# Patient Record
Sex: Female | Born: 1985 | Race: Black or African American | Hispanic: No | Marital: Married | Smoking: Former smoker
Health system: Southern US, Community
[De-identification: ages and names within clinical notes are randomized; demographics above are authoritative.]

## PROBLEM LIST (undated history)

## (undated) DIAGNOSIS — F419 Anxiety disorder, unspecified: Secondary | ICD-10-CM

## (undated) DIAGNOSIS — R0600 Dyspnea, unspecified: Secondary | ICD-10-CM

## (undated) DIAGNOSIS — M359 Systemic involvement of connective tissue, unspecified: Secondary | ICD-10-CM

## (undated) DIAGNOSIS — R002 Palpitations: Secondary | ICD-10-CM

## (undated) DIAGNOSIS — IMO0002 Reserved for concepts with insufficient information to code with codable children: Secondary | ICD-10-CM

## (undated) DIAGNOSIS — K219 Gastro-esophageal reflux disease without esophagitis: Secondary | ICD-10-CM

## (undated) DIAGNOSIS — I499 Cardiac arrhythmia, unspecified: Secondary | ICD-10-CM

## (undated) DIAGNOSIS — I10 Essential (primary) hypertension: Secondary | ICD-10-CM

## (undated) DIAGNOSIS — G4730 Sleep apnea, unspecified: Secondary | ICD-10-CM

## (undated) DIAGNOSIS — K589 Irritable bowel syndrome without diarrhea: Secondary | ICD-10-CM

## (undated) DIAGNOSIS — M329 Systemic lupus erythematosus, unspecified: Secondary | ICD-10-CM

## (undated) DIAGNOSIS — T4145XA Adverse effect of unspecified anesthetic, initial encounter: Secondary | ICD-10-CM

## (undated) DIAGNOSIS — D693 Immune thrombocytopenic purpura: Secondary | ICD-10-CM

## (undated) DIAGNOSIS — Q632 Ectopic kidney: Secondary | ICD-10-CM

## (undated) DIAGNOSIS — T8859XA Other complications of anesthesia, initial encounter: Secondary | ICD-10-CM

## (undated) DIAGNOSIS — D649 Anemia, unspecified: Secondary | ICD-10-CM

## (undated) DIAGNOSIS — J45909 Unspecified asthma, uncomplicated: Secondary | ICD-10-CM

## (undated) DIAGNOSIS — G43909 Migraine, unspecified, not intractable, without status migrainosus: Secondary | ICD-10-CM

## (undated) HISTORY — DX: Sleep apnea, unspecified: G47.30

## (undated) HISTORY — PX: WRIST SURGERY: SHX841

## (undated) HISTORY — PX: HERNIA REPAIR: SHX51

## (undated) HISTORY — DX: Essential (primary) hypertension: I10

---
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
## 2007-09-25 ENCOUNTER — Emergency Department: Payer: Self-pay | Admitting: Emergency Medicine

## 2008-11-16 ENCOUNTER — Ambulatory Visit (HOSPITAL_COMMUNITY): Admission: RE | Admit: 2008-11-16 | Discharge: 2008-11-16 | Payer: Self-pay | Admitting: Internal Medicine

## 2008-11-19 ENCOUNTER — Emergency Department (HOSPITAL_COMMUNITY): Admission: EM | Admit: 2008-11-19 | Discharge: 2008-11-19 | Payer: Self-pay | Admitting: Emergency Medicine

## 2010-04-29 ENCOUNTER — Encounter: Payer: Self-pay | Admitting: Internal Medicine

## 2010-08-23 NOTE — H&P (Signed)
   NAMEMonee, Jones NO.:  [id number]   MEDICAL RECORD NO.:  [id number]                  PATIENT TYPE:   LOCATION:                                       FACILITY:   PHYSICIAN:  Dalia Heading, M.D.               DATE OF BIRTH:  1985-09-29   DATE OF ADMISSION:  DATE OF DISCHARGE:                                HISTORY & PHYSICAL   CHIEF COMPLAINT:  Recurrent left inguinal hernia.   HISTORY OF PRESENT ILLNESS:  Patient is a 26 year old black female who was  referred for evaluation and treatment of left groin pain and swelling.  It  has been present for one week.  It is made worse with straining.  She had  bilateral inguinal herniorrhaphies as an infant.  No nausea or vomiting had  been noted.   PAST MEDICAL HISTORY:  Unremarkable.   PAST SURGICAL HISTORY:  1. Bilateral inguinal herniorrhaphies.  2. Bilateral myringotomies.   CURRENT MEDICATIONS:  None.   ALLERGIES:  No known drug allergies.   REVIEW OF SYSTEMS:  Noncontributory.   PHYSICAL EXAMINATION:  GENERAL:  The patient is a well-developed, well-  nourished,  black female in no acute distress.  VITAL SIGNS:  She is afebrile and vital signs are stable.  LUNGS:  Clear to auscultation with equal breath sounds bilaterally.  HEART:  Reveals a regular rate and rhythm without S3, S4 or murmurs.  ABDOMEN:  Soft, nondistended.  She is tender over the left groin region with  a reducible left inguinal hernia noted.  No right inguinal hernia is noted.   IMPRESSION:  Recurrent left inguinal hernia.    PLAN:  The patient was scheduled for a left inguinal herniorrhaphy on September 14, 2002.  The risks and benefits of the procedure including bleeding,  infection and the possibility of recurrence of the hernia were fully  explained to the patient, gained informed consent.                                                Dalia Heading, M.D.    MAJ/MEDQ  D:  09/13/2002  T:  09/13/2002  Job:   [id number]   cc:   Donzetta Sprung  [address]  [place]  [place] [address]  Fax: [phone]

## 2010-08-23 NOTE — Op Note (Signed)
NAME:  Joann Jones, Joann Jones                            ACCOUNT NO.:  [id number]   MEDICAL RECORD NO.:  [id number]                   PATIENT TYPE:  AMB   LOCATION:  DAY                                  FACILITY:  [facility]   PHYSICIAN:  Dalia Heading, M.D.               DATE OF BIRTH:  06-08-1985   DATE OF PROCEDURE:  09/14/2002  DATE OF DISCHARGE:                                 OPERATIVE REPORT   PREOPERATIVE DIAGNOSIS:  Recurrent left inguinal hernia.   POSTOPERATIVE DIAGNOSIS:  Recurrent left inguinal hernia, granuloma/abscess  in floor of inguinal canal.   PROCEDURE:  Left groin exploration, recurrent left inguinal herniorrhaphy.   SURGEON:  Dalia Heading, M.D.   ANESTHESIA:  General endotracheal.   INDICATIONS FOR PROCEDURE:  The patient is a 25 year old black female status  post bilateral inguinal herniorrhaphies as a newborn who now presents with  pain and swelling in the left groin region. The risks and benefits of the  procedure including bleeding, infection, and the possibility of recurrence  of the hernia were fully explained to the patient's mother, who gave  informed consent for the patient as the patient is a minor.   DESCRIPTION OF PROCEDURE:  The patient was placed in the supine position.  After induction of general endotracheal anesthesia, the left groin region  was prepped and draped using the usual sterile technique with Betadine.  Surgical site confirmation was performed.   An elliptical incision was made through the previous puckered scar in the  left groin regions. This was taken out into the external oblique  aponeurosis. The aponeurosis was incised. Interestingly, a small abscess  cavity was found in close proximity to the pubic tubercle. This appeared to  be arising from the transversalis fascia muscle area attaching to the pubic  tubercle. Anaerobic and aerobic cultures were taken and sent to  microbiology. Any granulation tissue was curetted. Any bleeding  was  controlled using Bovie electrocautery. Given that there was a defect in this  region. This was closed with 4-0 Tycron interrupted sutures. The wound was  copiously irrigated with normal saline. The external oblique aponeurosis was  then reapproximated using 3-0 Vicryl interrupted sutures. The subcutaneous  layer was reapproximated using a 3-0 Vicryl interrupted suture. The skin was  closed using staples. 0.25% Marcaine was instilled into the surrounding  wound. Betadine ointment and dry sterile dressings were applied. All tape  and needle counts were correct at the end of the procedure. The patient was  extubated in the operating room and went back to the recovery room awake in  stable condition.   COMPLICATIONS:  None.    SPECIMENS:  None.   ESTIMATED BLOOD LOSS:  Minimal.  Dalia Heading, M.D.    MAJ/MEDQ  D:  09/14/2002  T:  09/14/2002  Job:  [id number]   cc:   Donzetta Sprung  [address], [address]  [place]  [place] [address]  Fax: [phone]

## 2015-02-13 ENCOUNTER — Encounter: Payer: Self-pay | Admitting: Emergency Medicine

## 2015-02-13 ENCOUNTER — Ambulatory Visit
Admission: EM | Admit: 2015-02-13 | Discharge: 2015-02-13 | Disposition: A | Discharge status: Home / Self Care | Payer: BLUE CROSS/BLUE SHIELD | Attending: Family Medicine | Admitting: Family Medicine

## 2015-02-13 DIAGNOSIS — L03011 Cellulitis of right finger: Secondary | ICD-10-CM | POA: Diagnosis not present

## 2015-02-13 DIAGNOSIS — N390 Urinary tract infection, site not specified: Secondary | ICD-10-CM | POA: Diagnosis not present

## 2015-02-13 DIAGNOSIS — IMO0001 Reserved for inherently not codable concepts without codable children: Secondary | ICD-10-CM

## 2015-02-13 DIAGNOSIS — A084 Viral intestinal infection, unspecified: Secondary | ICD-10-CM | POA: Diagnosis not present

## 2015-02-13 HISTORY — DX: Systemic lupus erythematosus, unspecified: M32.9

## 2015-02-13 HISTORY — DX: Reserved for concepts with insufficient information to code with codable children: IMO0002

## 2015-02-13 LAB — URINALYSIS COMPLETE WITH MICROSCOPIC (ARMC ONLY)
Glucose, UA: NEGATIVE mg/dL
Nitrite: NEGATIVE
PH: 6 (ref 5.0–8.0)
Specific Gravity, Urine: >1.03 — ABNORMAL HIGH (ref 1.005–1.030)

## 2015-02-13 LAB — COMPREHENSIVE METABOLIC PANEL
ALBUMIN: 4.2 g/dL (ref 3.5–5.0)
ALT: 114 U/L — AB (ref 14–54)
AST: 73 U/L — AB (ref 15–41)
Alkaline Phosphatase: 67 U/L (ref 38–126)
Anion gap: 13 (ref 5–15)
BUN: 10 mg/dL (ref 6–20)
CHLORIDE: 100 mmol/L — AB (ref 101–111)
CO2: 24 mmol/L (ref 22–32)
CREATININE: 0.77 mg/dL (ref 0.44–1.00)
Calcium: 9.4 mg/dL (ref 8.9–10.3)
GFR calc Af Amer: >60 mL/min (ref >60)
GFR calc non Af Amer: >60 mL/min (ref >60)
GLUCOSE: 81 mg/dL (ref 65–99)
POTASSIUM: 3.8 mmol/L (ref 3.5–5.1)
Sodium: 137 mmol/L (ref 135–145)
Total Bilirubin: 0.7 mg/dL (ref 0.3–1.2)
Total Protein: 7.6 g/dL (ref 6.5–8.1)

## 2015-02-13 LAB — CBC WITH DIFFERENTIAL/PLATELET
BASOS ABS: 0 10*3/uL (ref 0–0.1)
BASOS PCT: 1 %
Eosinophils Absolute: 0 10*3/uL (ref 0–0.7)
Eosinophils Relative: 1 %
HCT: 42.5 % (ref 35.0–47.0)
Hemoglobin: 14.2 g/dL (ref 12.0–16.0)
LYMPHS ABS: 1.4 10*3/uL (ref 1.0–3.6)
LYMPHS PCT: 25 %
MCH: 31.4 pg (ref 26.0–34.0)
MCHC: 33.4 g/dL (ref 32.0–36.0)
MCV: 93.9 fL (ref 80.0–100.0)
MONOS PCT: 9 %
Monocytes Absolute: 0.5 10*3/uL (ref 0.2–0.9)
NEUTROS ABS: 3.5 10*3/uL (ref 1.4–6.5)
NEUTROS PCT: 64 %
PLATELETS: 136 10*3/uL — AB (ref 150–440)
RBC: 4.52 MIL/uL (ref 3.80–5.20)
RDW: 13.4 % (ref 11.5–14.5)
WBC: 5.5 10*3/uL (ref 3.6–11.0)

## 2015-02-13 LAB — LIPASE, BLOOD: Lipase: 20 U/L (ref 11–51)

## 2015-02-13 LAB — PREGNANCY, URINE: PREG TEST UR: NEGATIVE

## 2015-02-13 MED ORDER — ONDANSETRON 4 MG PO TBDP
4.0000 mg | ORAL_TABLET | Freq: Once | ORAL | Status: AC
  Administered 2015-02-13: 4 mg via ORAL

## 2015-02-13 MED ORDER — NITROFURANTOIN MONOHYD MACRO 100 MG PO CAPS: 100.0000 mg | ORAL_CAPSULE | Freq: Two times a day (BID) | ORAL | Status: DC

## 2015-02-13 MED ORDER — ONDANSETRON 4 MG PO TBDP: 4.0000 mg | ORAL_TABLET | Freq: Three times a day (TID) | ORAL | Status: DC | PRN

## 2015-02-13 NOTE — Discharge Instructions (Signed)
Take medication as prescribed. Rest. Drink plenty of fluids. Follow the brat diet. Continue to soak right third finger, keep clean, and apply topical antibiotics as discussed.   Follow-up closely with her primary care physician this week. As discussed follow-up regarding elevated liver enzymes.  Return to the urgent care or proceed to the emergency room for fever, abdominal pain, back pain, inability to tolerate food or fluids, new or worsening concerns.  Urinary Tract Infection A urinary tract infection (UTI) can occur any place along the urinary tract. The tract includes the kidneys, ureters, bladder, and urethra. A type of germ called bacteria often causes a UTI. UTIs are often helped with antibiotic medicine.  HOME CARE   If given, take antibiotics as told by your doctor. Finish them even if you start to feel better.  Drink enough fluids to keep your pee (urine) clear or pale yellow.  Avoid tea, drinks with caffeine, and bubbly (carbonated) drinks.  Pee often. Avoid holding your pee in for a long time.  Pee before and after having sex (intercourse).  Wipe from front to back after you poop (bowel movement) if you are a woman. Use each tissue only once. GET HELP RIGHT AWAY IF:   You have back pain.  You have lower belly (abdominal) pain.  You have chills.  You feel sick to your stomach (nauseous).  You throw up (vomit).  Your burning or discomfort with peeing does not go away.  You have a fever.  Your symptoms are not better in 3 days. MAKE SURE YOU:   Understand these instructions.  Will watch your condition.  Will get help right away if you are not doing well or get worse.   This information is not intended to replace advice given to you by your health care provider. Make sure you discuss any questions you have with your health care provider.   Document Released: 09/10/2007 Document Revised: 04/14/2014 Document Reviewed: 10/23/2011 Elsevier Interactive Patient  Education 2016 [employer]  Viral Gastroenteritis Viral gastroenteritis is also called stomach flu. This illness is caused by a certain type of germ (virus). It can cause sudden watery poop (diarrhea) and throwing up (vomiting). This can cause you to lose body fluids (dehydration). This illness usually lasts for 3 to 8 days. It usually goes away on its own. HOME CARE   Drink enough fluids to keep your pee (urine) clear or pale yellow. Drink small amounts of fluids often.  Ask your doctor how to replace body fluid losses (rehydration).  Avoid:  Foods high in sugar.  Alcohol.  Bubbly (carbonated) drinks.  Tobacco.  Juice.  Caffeine drinks.  Very hot or cold fluids.  Fatty, greasy foods.  Eating too much at one time.  Dairy products until 24 to 48 hours after your watery poop stops.  You may eat foods with active cultures (probiotics). They can be found in some yogurts and supplements.  Wash your hands well to avoid spreading the illness.  Only take medicines as told by your doctor. Do not give aspirin to children. Do not take medicines for watery poop (antidiarrheals).  Ask your doctor if you should keep taking your regular medicines.  Keep all doctor visits as told. GET HELP RIGHT AWAY IF:   You cannot keep fluids down.  You do not pee at least once every 6 to 8 hours.  You are short of breath.  You see blood in your poop or throw up. This may look like coffee grounds.  You  have belly (abdominal) pain that gets worse or is just in one small spot (localized).  You keep throwing up or having watery poop.  You have a fever.  The patient is a child younger than 3 months, and he or she has a fever.  The patient is a child older than 3 months, and he or she has a fever and problems that do not go away.  The patient is a child older than 3 months, and he or she has a fever and problems that suddenly get worse.  The patient is a baby, and he or she has no  tears when crying. MAKE SURE YOU:   Understand these instructions.  Will watch your condition.  Will get help right away if you are not doing well or get worse.   This information is not intended to replace advice given to you by your health care provider. Make sure you discuss any questions you have with your health care provider.   Document Released: 09/10/2007 Document Revised: 06/16/2011 Document Reviewed: 01/08/2011 Elsevier Interactive Patient Education 2016 Else[employer] Inc.  Paronychia  Paronychia is an infection of the skin. It happens near a fingernail or toenail. It may cause pain and swelling around the nail. Usually, it is not serious and it clears up with treatment. HOME CARE  Soak the fingers or toes in warm water as told by your doctor. You may be told to do this for 20 minutes, 2-3 times a day.  Keep the area dry when you are not soaking it.  Take medicines only as told by your doctor.  If you were given an antibiotic medicine, finish all of it even if you start to feel better.  Keep the affected area clean.  Do not try to drain a fluid-filled bump yourself.  Wear rubber gloves when putting your hands in water.  Wear gloves if your hands might touch cleaners or chemicals.  Follow your doctor's instructions about:  Wound care.  Bandage (dressing) changes and removal. GET HELP IF:  Your symptoms get worse or do not improve.  You have a fever or chills.  You have redness spreading from the affected area.  You have more fluid, blood, or pus coming from the affected area.  Your finger or knuckle is swollen or is hard to move.   This information is not intended to replace advice given to you by your health care provider. Make sure you discuss any questions you have with your health care provider.   Document Released: 03/12/2009 Document Revised: 08/08/2014 Document Reviewed: 03/01/2014 Elsevier Interactive Patient Education [employer]

## 2015-02-13 NOTE — ED Provider Notes (Signed)
[facility]  ____________________________________________  Time seen: Approximately 4:14 PM  I have reviewed the triage vital signs and the nursing notes.    HISTORY  Chief Complaint Emesis   HPI Joann Jones is a 29 y.o. female presents for complaints of approximately 4 days of intermittent nausea and vomiting. States mild nausea at this time. Patient reports that approximately one episode of vomiting per day but with more frequent nausea but not constant. Patient states 2 days with diarrhea and loose stool, approximately 2-3 episodes each day. Denies stool color changes, black stool, blood in stool or blood in toilet. Denies vomit changes, blood in vomit or abnormal color.  Patient reports some crampy abdominal pain like she is "hungry". But denies continued abdominal pain. Denies known triggers or known associations. Reports continues to drink fluids better than eating. Reports last vomiting episode approximately 2 PM today which was only episode today. Reports that she has tolerated oral Gatorade since. Denies others at home with similar. Patient states she thinks she has a virus.   Patient reports that she is 5 weeks postpartum vaginal delivery. Denies pregnancy complications. G1P1.Marland Kitchen. Patient states that she was seen by her OB yesterday and cleared. Denies sexual activity. States last sexual activity approximately 4 months ago. States that she has had a menstrual cycle since pregnancy which ended on November 2. States she is NOT breast feeding. Denies vaginal bleeding, vaginal discharge or vaginal complaints. Denies back pain. Denies dysuria. Denies recent cough, congestion, sore throat or fever. Denies chest pain, shortness of breath, calf pain, leg swelling, fall or trauma.  Patient also reports that she wants her right third finger looked at. Patient states that she has had redness and swelling around her nail bed. Patient states that this happened after pulling a hangnail.  States that it has improved over last several days. States been there for about one to 2 weeks overall. Patient again states that it has improved and almost fully resolved. States right third finger tenderness is 2 out of 10.   PCP: In [place] [facility]: [facility]   past medical history. Lupus ITP  There are no active problems to display for this patient.    past surgical history. Inguinal Hernia repair.   Current Outpatient Rx  Name  Route  Sig  Dispense  Refill  . ferrous fumarate (HEMOCYTE - 106 MG FE) 325 (106 FE) MG TABS tablet   Oral   Take 1 tablet by mouth.         . hydroxychloroquine (PLAQUENIL) 200 MG tablet   Oral   Take by mouth daily.         Oral birth control: unsure of name  Allergies Septra and Tylenol  History reviewed. No pertinent family history.  Social History Social History  Substance Use Topics  . Smoking status: Never Smoker   . Smokeless tobacco: None  . Alcohol Use: Yes    Review of Systems Constitutional: No fever/chills Eyes: No visual changes. ENT: No sore throat. Cardiovascular: Denies chest pain. Respiratory: Denies shortness of breath. Gastrointestinal: No abdominal pain.  Positive nausea, vomiting diarrhea intermittently.   No constipation. Genitourinary: Negative for dysuria. Musculoskeletal: Negative for back pain. Skin: Negative for rash. Neurological: Negative for headaches, focal weakness or numbness.  10-point ROS otherwise negative.  ____________________________________________   PHYSICAL EXAM:  VITAL SIGNS: ED Triage Vitals  Enc Vitals Group     BP 02/13/15 1555 120/88 mmHg     Pulse Rate 02/13/15 1555 92  Resp 02/13/15 1555 18     Temp 02/13/15 1555 98.6 F (37 C)     Temp Source 02/13/15 1555 Tympanic     SpO2 02/13/15 1555 99 %     Weight 02/13/15 1555 130 lb (58.968 kg)     Height 02/13/15 1555  (1.575 m)     Head Cir --      Peak Flow --      Pain Score 02/13/15 1600 5     Pain Loc --      Pain  Edu? --      Excl. in GC? --     Constitutional: Alert and oriented. Well appearing and in no acute distress. Eyes: Conjunctivae are normal. PERRL. EOMI. Head: Atraumatic.  Ears: no erythema, normal TMs bilaterally.   Nose: No congestion/rhinnorhea.  Mouth/Throat: Mucous membranes are moist.  Oropharynx non-erythematous. Neck: No stridor.  No cervical spine tenderness to palpation. Hematological/Lymphatic/Immunilogical: No cervical lymphadenopathy. Cardiovascular: Normal rate, regular rhythm. Grossly normal heart sounds.  Good peripheral circulation. Respiratory: Normal respiratory effort.  No retractions. Lungs CTAB. No wheezes, rales, or rhonchi.  Gastrointestinal: Soft. Minimal diffuse tenderness. Negative rovsing, negative obturator. NO rebound tenderness. No distention. Normal Bowel sounds.  No abdominal bruits. No CVA tenderness. Musculoskeletal: No lower or upper extremity tenderness nor edema.  No joint effusions. Bilateral pedal pulses equal and easily palpated. Nontender bilateral calfs. No cervical, thoracic or lumbar tenderness.   Neurologic:  Normal speech and language. No gross focal neurologic deficits are appreciated. No gait instability. Skin:  Skin is warm, dry and intact. No rash noted. Except : Right distal third finger medial base of nail bed minimal swelling and minimal erythema, no fluctuance or induration. No drainage. Full range of motion. Minimal to mild tenderness to palpation. No tendon or motor deficit.  Psychiatric: Mood and affect are normal. Speech and behavior are normal.   ____________________________________________   LABS (all labs ordered are listed, but only abnormal results are displayed)  Labs Reviewed  CBC WITH DIFFERENTIAL/PLATELET - Abnormal; Notable for the following:    Platelets 136 (*)    All other components within normal limits  COMPREHENSIVE METABOLIC PANEL - Abnormal; Notable for the following:    Chloride 100 (*)    AST 73 (*)     ALT 114 (*)    All other components within normal limits  URINALYSIS COMPLETEWITH MICROSCOPIC ([facility] ONLY) - Abnormal; Notable for the following:    APPearance HAZY (*)    Bilirubin Urine 1+ (*)    Ketones, ur TRACE (*)    Specific Gravity, Urine >1.030 (*)    Hgb urine dipstick 3+ (*)    Protein, ur PRESENT (*)    Leukocytes, UA 1+ (*)    Bacteria, UA FEW (*)    Squamous Epithelial / LPF 6-30 (*)    All other components within normal limits  URINE CULTURE  LIPASE, BLOOD  PREGNANCY, URINE      INITIAL IMPRESSION / ASSESSMENT AND PLAN / ED COURSE  Pertinent labs & imaging results that were available during my care of the patient were reviewed by me and considered in my medical decision making (see chart for details).  Very well-appearing patient. No acute distress. Presents for the complaints of 3-4 days of intermittent nausea, vomiting and diarrhea. Moist mucous membranes. Patient tolerating oral Gatorade in room. Abdomen with minimal diffuse tenderness, changes position from lying to sitting quickly without complaints of abdominal pain. Lungs clear throughout. Zofran oral x 1. Will evaluate  cbc, cmp, lipase and urinalysis. By mouth challenge. Patient also with mild right third finger paronychia, no I&D indicated, appears to be resolving. Discussed with patient to continue warm soaks, topical over-the-counter antibiotics and monitor.   1930: Abdomen reexamined, abdomen soft and nontender.Denies abdominal pain. Patient reports improving and feeling well. States nausea have improved and tolerating oral fluids and foods in urgent care. Denies current nausea. Labs reviewed. Suspect urinary tract infection, will culture. Suspect patient with viral gastroenteritis with secondary urinary tract infection. No CVA tenderness or back pain.Afebrile and denies report of fevers.  Discussed patient and plan of care with Dr. Thurmond Butts who also reviewed labs. Patient does have elevated AST and ALT, which are  approximately twice normal. No right upper quadrant abdominal pain. Patient denies frequent over-the-counter medication use or frequent alcohol use. Discussed with patient to follow-up with her primary care physician and to have follow-up on these labs. Patient verbalized understanding of this and states that she will follow-up for repeat labs. Patient reports that she is feeling better and ready to go. Patient states that she is hungry and ready to go and get more food.Discussed follow up with Primary care physician this week. Discussed follow up and return parameters including no resolution or any worsening concerns. Patient verbalized understanding and agreed to plan.   ____________________________________________   FINAL CLINICAL IMPRESSION(S) / ED DIAGNOSES  Final diagnoses:  UTI (lower urinary tract infection)  Viral gastroenteritis  Paronychia of third finger, right       Renford Dills, NP 02/13/15 1854    Renford Dills, NP 02/13/15 2107

## 2015-02-13 NOTE — ED Notes (Signed)
Pt with nausea and vomiting x 4 days

## 2015-02-15 LAB — URINE CULTURE: Special Requests: NORMAL

## 2015-02-21 ENCOUNTER — Ambulatory Visit
Admission: EM | Admit: 2015-02-21 | Discharge: 2015-02-21 | Disposition: A | Discharge status: Home / Self Care | Payer: BLUE CROSS/BLUE SHIELD | Attending: Family Medicine | Admitting: Family Medicine

## 2015-02-21 DIAGNOSIS — R112 Nausea with vomiting, unspecified: Secondary | ICD-10-CM

## 2015-02-21 DIAGNOSIS — R1011 Right upper quadrant pain: Secondary | ICD-10-CM

## 2015-02-21 HISTORY — DX: Irritable bowel syndrome, unspecified: K58.9

## 2015-02-21 HISTORY — DX: Immune thrombocytopenic purpura: D69.3

## 2015-02-21 HISTORY — DX: Migraine, unspecified, not intractable, without status migrainosus: G43.909

## 2015-02-21 MED ORDER — ONDANSETRON 4 MG PO TBDP
4.0000 mg | ORAL_TABLET | Freq: Once | ORAL | Status: AC
  Administered 2015-02-21: 4 mg via ORAL

## 2015-02-21 NOTE — ED Provider Notes (Signed)
[facility]  ____________________________________________  Time seen: Approximately 4:53 PM  I have reviewed the triage vital signs and the nursing notes.   HISTORY  Chief Complaint Emesis   HPI Ruta HindsJanee C Minks is a 29 y.o. female presents for the complaints of 1 week of intermittent nausea, vomiting and diarrhea. Patient reports that she was seen here in urgent care last week diagnosed with UTI and states that she finish the Macrobid antibiotic yesterday. Patient states she does not vomit every day but as a whole she has felt nauseated everyday. Patient states that today she vomited approximately 5 times today. Patient also reports that she had several episodes of diarrhea this morning. Patient states that she did have some increased nausea and vomiting associated with after eating fried fatty food. Denies changes of stool colors or emesis colors, denies blood in stool, blood in toilet or dark stool. Patient also reports that she has had intermittent mid abdominal pain over the last week but has been more constant today. Patient states that she had some abdominal pain last week but states that it resolved at that time. Patient states the last several days it has been more persistent. States current abdominal pain is 5 out of 10 to the upper right abdomen. Also reports some back pain. Denies dysuria, vaginal complaints, vaginal bleeding, vaginal discharge. Denies chest pain, shortness of breath, rash, leg pain or swelling, trauma, sick contacts. Denies recent cough, congestion, runny nose or sore throat. Denies weight loss.   Patient reports that she is 5 weeks postpartum vaginal delivery. Denies pregnancy complications. G1P1.Marland Kitchen. Patient states that she was seen by her OB last week.  Denies sexual activity. States last sexual activity approximately 4 months ago.  States she is NOT breast feeding.  Past Medical History  Diagnosis Date  . Lupus (HCC)   . IBS (irritable bowel syndrome)   .  ITP (idiopathic thrombocytopenic purpura)   . Migraines     There are no active problems to display for this patient.   Past Surgical History  Procedure Laterality Date  . Hernia repair      Current Outpatient Rx  Name  Route  Sig  Dispense  Refill  . hydroxychloroquine (PLAQUENIL) 200 MG tablet   Oral   Take by mouth daily.         . Levonorgestrel-Ethinyl Estradiol (SEASONIQUE) 0.15-0.03 &0.01 MG tablet   Oral   Take 1 tablet by mouth daily.         .           . ferrous fumarate (HEMOCYTE - 106 MG FE) 325 (106 FE) MG TABS tablet   Oral   Take 1 tablet by mouth.         .             Allergies Septra and Tylenol  Family History  Problem Relation Age of Onset  . Hypertension Mother   . Hyperlipidemia Mother   . Hypertension Father   . Hyperlipidemia Father     Social History Social History  Substance Use Topics  . Smoking status: Never Smoker   . Smokeless tobacco: None  . Alcohol Use: Yes     Comment: socially    Review of Systems Constitutional: No fever/chills Eyes: No visual changes. ENT: No sore throat. Cardiovascular: Denies chest pain. Respiratory: Denies shortness of breath. Gastrointestinal: positive abdominal pain.  Positive nausea, vomiting and diarrhea.  No constipation. Genitourinary: Negative for dysuria. Musculoskeletal: Negative for back pain. Skin:  Negative for rash. Neurological: Negative for headaches, focal weakness or numbness.  10-point ROS otherwise negative.  ____________________________________________   PHYSICAL EXAM:  VITAL SIGNS: ED Triage Vitals  Enc Vitals Group     BP 02/21/15 1611 122/83 mmHg     Pulse Rate 02/21/15 1611 80     Resp 02/21/15 1611 16     Temp 02/21/15 1611 98.1 F (36.7 C)     Temp Source 02/21/15 1611 Tympanic     SpO2 02/21/15 1611 100 %     Weight 02/21/15 1611 130 lb (58.968 kg)     Height 02/21/15 1611  (1.549 m)     Head Cir --      Peak Flow --      Pain Score 02/21/15  1614 6     Pain Loc --      Pain Edu? --      Excl. in GC? --     Constitutional: Alert and oriented. Well appearing and in no acute distress. Eyes: Conjunctivae are normal. PERRL. EOMI. Head: Atraumatic.  Ears: no erythema, normal TMs bilaterally.   Nose: No congestion/rhinnorhea.  Mouth/Throat: Mucous membranes are moist.  Oropharynx non-erythematous. Neck: No stridor.  No cervical spine tenderness to palpation. Hematological/Lymphatic/Immunilogical: No cervical lymphadenopathy. Cardiovascular: Normal rate, regular rhythm. Grossly normal heart sounds.  Good peripheral circulation. Respiratory: Normal respiratory effort.  No retractions. Lungs CTAB. Gastrointestinal: Mod TTP epigastric and RUQ with positive murphy's sign. Otherwise abdomen nontender. Normal Bowel sounds.  No abdominal bruits. Minimal right CVA tenderness. No left CVA tenderness.  Musculoskeletal: No lower or upper extremity tenderness nor edema.  No joint effusions. Bilateral pedal pulses equal and easily palpated. No calf tenderness bilaterally.  Neurologic:  Normal speech and language. No gross focal neurologic deficits are appreciated. No gait instability. Skin:  Skin is warm, dry and intact. No rash noted. Psychiatric: Mood and affect are normal. Speech and behavior are normal.  ____________________________________________   LABS (all labs ordered are listed, but only abnormal results are displayed)  Labs Reviewed - No data to display   INITIAL IMPRESSION / ASSESSMENT AND PLAN / ED COURSE  Pertinent labs & imaging results that were available during my care of the patient were reviewed by me and considered in my medical decision making (see chart for details).  Well-appearing patient. No acute distress. Presents for the complaints of continued intermittent nausea, vomiting and diarrhea. States has been taken home Zofran with some help but states she continues with nausea and intermittent vomiting. States has  not had any Zofran today. Patient also reports intermittent abdominal pain. Lungs clear throughout. Moist mucous membranes. Moderate tenderness to palpation epigastric as well as right upper quadrant with positive Murphy sign. ODT 4 mg Zofran given 1 in urgent care. Discussed with patient as at last visit she  had elevated liver enzymes as well as continued nausea vomiting diarrhea and abdominal pain with right upper quadrant tenderness concerning for cholecystitis, encouraged patient that she should be evaluated for further evaluation in the emergency room. Patient states that she understands this and  discussed she would  like to go to [facility].RN to call report. Patient alert and oriented with decisional capacity and states that she will drive herself directly there. Discussed follow up with Primary care physician this week. Discussed follow up and return parameters including no resolution or any worsening concerns. Patient verbalized understanding and agreed to plan.   ____________________________________________   FINAL CLINICAL IMPRESSION(S) / ED DIAGNOSES  Final diagnoses:  Right upper quadrant pain  Non-intractable vomiting with nausea, vomiting of unspecified type       Renford Dills, NP 02/22/15 1511

## 2015-02-21 NOTE — ED Notes (Signed)
Seen here 1 week ago with c/o mid abdominal pain, vomiting and headache. Dx with UTI and just finished Macrobid. Also told had gastro enteritis. Continued to be nauseated. Poor appetite and today vomited x 5 times and mid abdominal pain started again. Instructed to follow up with PMD but does not have PMD

## 2015-02-21 NOTE — Discharge Instructions (Signed)
Go directly to the Emergency room of your choice as discussed. Return to Urgent care as needed for new or worsening concerns.   Nausea and Vomiting Nausea means you feel sick to your stomach. Throwing up (vomiting) is a reflex where stomach contents come out of your mouth. HOME CARE   Take medicine as told by your doctor.  Do not force yourself to eat. However, you do need to drink fluids.  If you feel like eating, eat a normal diet as told by your doctor.  Eat rice, wheat, potatoes, bread, lean meats, yogurt, fruits, and vegetables.  Avoid high-fat foods.  Drink enough fluids to keep your pee (urine) clear or pale yellow.  Ask your doctor how to replace body fluid losses (rehydrate). Signs of body fluid loss (dehydration) include:  Feeling very thirsty.  Dry lips and mouth.  Feeling dizzy.  Dark pee.  Peeing less than normal.  Feeling confused.  Fast breathing or heart rate. GET HELP RIGHT AWAY IF:   You have blood in your throw up.  You have black or bloody poop (stool).  You have a bad headache or stiff neck.  You feel confused.  You have bad belly (abdominal) pain.  You have chest pain or trouble breathing.  You do not pee at least once every 8 hours.  You have cold, clammy skin.  You keep throwing up after 24 to 48 hours.  You have a fever. MAKE SURE YOU:   Understand these instructions.  Will watch your condition.  Will get help right away if you are not doing well or get worse.   This information is not intended to replace advice given to you by your health care provider. Make sure you discuss any questions you have with your health care provider.   Document Released: 09/10/2007 Document Revised: 06/16/2011 Document Reviewed: 08/23/2010 Elsevier Interactive Patient Education 2016 Elsevier Inc.  Abdominal Pain, Adult Many things can cause abdominal pain. Usually, abdominal pain is not caused by a disease and will improve without treatment. It  can often be observed and treated at home. Your health care provider will do a physical exam and possibly order blood tests and X-rays to help determine the seriousness of your pain. However, in many cases, more time must pass before a clear cause of the pain can be found. Before that point, your health care provider may not know if you need more testing or further treatment. HOME CARE INSTRUCTIONS Monitor your abdominal pain for any changes. The following actions may help to alleviate any discomfort you are experiencing:  Only take over-the-counter or prescription medicines as directed by your health care provider.  Do not take laxatives unless directed to do so by your health care provider.  Try a clear liquid diet (broth, tea, or water) as directed by your health care provider. Slowly move to a bland diet as tolerated. SEEK MEDICAL CARE IF:  You have unexplained abdominal pain.  You have abdominal pain associated with nausea or diarrhea.  You have pain when you urinate or have a bowel movement.  You experience abdominal pain that wakes you in the night.  You have abdominal pain that is worsened or improved by eating food.  You have abdominal pain that is worsened with eating fatty foods.  You have a fever. SEEK IMMEDIATE MEDICAL CARE IF:  Your pain does not go away within 2 hours.  You keep throwing up (vomiting).  Your pain is felt only in portions of the abdomen, such  as the right side or the left lower portion of the abdomen.  You pass bloody or black tarry stools. MAKE SURE YOU:  Understand these instructions.  Will watch your condition.  Will get help right away if you are not doing well or get worse.   This information is not intended to replace advice given to you by your health care provider. Make sure you discuss any questions you have with your health care provider.   Document Released: 01/01/2005 Document Revised: 12/13/2014 Document Reviewed:  12/01/2012 Elsevier Interactive Patient Education [employer]2016 Elsevier Inc.

## 2015-07-02 ENCOUNTER — Ambulatory Visit
Admission: RE | Admit: 2015-07-02 | Discharge: 2015-07-02 | Disposition: A | Discharge status: Home / Self Care | Payer: BLUE CROSS/BLUE SHIELD | Source: Ambulatory Visit | Attending: Physician Assistant | Admitting: Physician Assistant

## 2015-07-02 ENCOUNTER — Other Ambulatory Visit: Payer: Self-pay | Admitting: Physician Assistant

## 2015-07-02 DIAGNOSIS — K625 Hemorrhage of anus and rectum: Secondary | ICD-10-CM

## 2015-07-02 DIAGNOSIS — R1084 Generalized abdominal pain: Secondary | ICD-10-CM

## 2015-09-03 ENCOUNTER — Telehealth: Payer: Self-pay | Admitting: Emergency Medicine

## 2015-09-03 ENCOUNTER — Ambulatory Visit
Admission: EM | Admit: 2015-09-03 | Discharge: 2015-09-03 | Disposition: A | Discharge status: Home / Self Care | Payer: BLUE CROSS/BLUE SHIELD | Attending: Family Medicine | Admitting: Family Medicine

## 2015-09-03 ENCOUNTER — Encounter: Payer: Self-pay | Admitting: *Deleted

## 2015-09-03 DIAGNOSIS — H00033 Abscess of eyelid right eye, unspecified eyelid: Secondary | ICD-10-CM | POA: Diagnosis not present

## 2015-09-03 MED ORDER — CEPHALEXIN 500 MG PO CAPS: 500.0000 mg | ORAL_CAPSULE | Freq: Three times a day (TID) | ORAL | Status: DC

## 2015-09-03 MED ORDER — CEPHALEXIN 500 MG PO CAPS
500.0000 mg | ORAL_CAPSULE | Freq: Three times a day (TID) | ORAL | Status: DC
Start: 2015-09-03 — End: 2015-09-04

## 2015-09-03 NOTE — ED Notes (Signed)
Both eyes draining yellow fluid and matting in the am. Also, eyes itching and red.

## 2015-09-03 NOTE — ED Provider Notes (Signed)
CSN: [id number]     Arrival date & time 09/03/15  1407 History   First MD Initiated Contact with Patient 09/03/15 1639     Chief Complaint  Patient presents with  . Eye Drainage  . Conjunctivitis   (Consider location/radiation/quality/duration/timing/severity/associated sxs/prior Treatment) HPI Comments: 30 yo female with a c/o 2 days of mild redness and swelling to right eyelid. Denies any trauma, injuries, fevers, chills, vision changes.  Does not use contact lenses.   Patient is a 30 y.o. female presenting with conjunctivitis. The history is provided by the patient.  Conjunctivitis    Past Medical History  Diagnosis Date  . Lupus (HCC)   . IBS (irritable bowel syndrome)   . ITP (idiopathic thrombocytopenic purpura)   . Migraines    Past Surgical History  Procedure Laterality Date  . Hernia repair     Family History  Problem Relation Age of Onset  . Hypertension Mother   . Hyperlipidemia Mother   . Hypertension Father   . Hyperlipidemia Father    Social History  Substance Use Topics  . Smoking status: Never Smoker   . Smokeless tobacco: None  . Alcohol Use: Yes     Comment: socially   OB History    No data available     Review of Systems  Allergies  Septra and Tylenol  Home Medications   Prior to Admission medications   Medication Sig Start Date End Date Taking? Authorizing Provider  escitalopram (LEXAPRO) 20 MG tablet Take 20 mg by mouth daily.   Yes Historical Provider, MD  hydroxychloroquine (PLAQUENIL) 200 MG tablet Take by mouth daily.   Yes Historical Provider, MD  Levonorgestrel-Ethinyl Estradiol (SEASONIQUE) 0.15-0.03 &0.01 MG tablet Take 1 tablet by mouth daily.   Yes Historical Provider, MD  omeprazole (PRILOSEC) 20 MG capsule Take 20 mg by mouth daily.   Yes Historical Provider, MD  cephALEXin (KEFLEX) 500 MG capsule Take 1 capsule (500 mg total) by mouth 3 (three) times daily. 09/03/15   Payton Mccallumrlando Saphyre Cillo, MD  ferrous fumarate (HEMOCYTE - 106 MG FE)  325 (106 FE) MG TABS tablet Take 1 tablet by mouth.    Historical Provider, MD  nitrofurantoin, macrocrystal-monohydrate, (MACROBID) 100 MG capsule Take 1 capsule (100 mg total) by mouth 2 (two) times daily. 02/13/15   Renford DillsLindsey Miller, NP  ondansetron (ZOFRAN ODT) 4 MG disintegrating tablet Take 1 tablet (4 mg total) by mouth every 8 (eight) hours as needed for nausea or vomiting. 02/13/15   Renford DillsLindsey Miller, NP   Meds Ordered and Administered this Visit  Medications - No data to display  BP 119/64 mmHg  Pulse 96  Temp(Src) 97.7 F (36.5 C) (Oral)  Resp 16  Ht 5\' 4"  (1.626 m)  Wt 127 lb (57.607 kg)  BMI 21.79 kg/m2  SpO2 100%  LMP 08/20/2015 No data found.   Physical Exam  Constitutional: She appears well-developed and well-nourished. No distress.  Eyes: Conjunctivae and EOM are normal. Pupils are equal, round, and reactive to light. Right eye exhibits no discharge and no exudate.    Mild right lower and upper eyelid blanchable erythema, warmth and mild tenderness to palpation  Skin: She is not diaphoretic.  Nursing note and vitals reviewed.   ED Course  Procedures (including critical care time)  Labs Review Labs Reviewed - No data to display  Imaging Review No results found.   Visual Acuity Review  Right Eye Distance: 20/25 Left Eye Distance: 20/20 Bilateral Distance: 20/25  Right Eye Near:  Left Eye Near:    Bilateral Near:         MDM   1. Cellulitis of eyelid, right    Discharge Medication List as of 09/03/2015  4:51 PM    START taking these medications   Details  cephALEXin (KEFLEX) 500 MG capsule Take 1 capsule (500 mg total) by mouth 3 (three) times daily., Starting 09/03/2015, Until Discontinued, Normal       1.  diagnosis reviewed with patient 2. rx as per orders above; reviewed possible side effects, interactions, risks and benefits  3. Recommend supportive treatment with warm compresses to affected area 4. Follow-up prn if symptoms worsen or  don't improve    Payton Mccallum, MD 09/03/15 [phone]

## 2015-09-04 ENCOUNTER — Encounter: Payer: Self-pay | Admitting: *Deleted

## 2015-09-04 ENCOUNTER — Ambulatory Visit
Admission: EM | Admit: 2015-09-04 | Discharge: 2015-09-04 | Disposition: A | Discharge status: Home / Self Care | Payer: BLUE CROSS/BLUE SHIELD | Attending: Internal Medicine | Admitting: Internal Medicine

## 2015-09-04 DIAGNOSIS — H109 Unspecified conjunctivitis: Secondary | ICD-10-CM

## 2015-09-04 MED ORDER — POLYMYXIN B-TRIMETHOPRIM 10000-0.1 UNIT/ML-% OP SOLN: 2.0000 [drp] | OPHTHALMIC | Status: AC

## 2015-09-04 NOTE — ED Provider Notes (Signed)
CSN: [id number]     Arrival date & time 09/04/15  1000 History   First MD Initiated Contact with Patient 09/04/15 1020     Chief Complaint  Patient presents with  . Conjunctivitis   (Consider location/radiation/quality/duration/timing/severity/associated sxs/prior Treatment) HPI Comments: The patient presents to clinic complaining of redness and irritation to her left eye. She denies any pain. She was seen in clinic yesterday and diagnosed with preseptal cellulitis and prescribed Keflex. Notably the patient has completed a course of penicillin for strep throat a few days prior. She started the new antibiotic prescription and has not seen any proof that and irritation or redness. She awoke with mucus crusting of her eye this morning. The patient denies fevers, nausea vomiting or diarrhea. She has a 65-month-old son who is unaffected at this time.  Patient is a 30 y.o. female presenting with conjunctivitis. The history is provided by the patient.  Conjunctivitis This is a new problem. The problem has not changed since onset.Pertinent negatives include no abdominal pain. Nothing aggravates the symptoms. Nothing relieves the symptoms. She has tried a warm compress for the symptoms. The treatment provided no relief.    Past Medical History  Diagnosis Date  . Lupus (HCC)   . IBS (irritable bowel syndrome)   . ITP (idiopathic thrombocytopenic purpura)   . Migraines    Past Surgical History  Procedure Laterality Date  . Hernia repair     Family History  Problem Relation Age of Onset  . Hypertension Mother   . Hyperlipidemia Mother   . Hypertension Father   . Hyperlipidemia Father    Social History  Substance Use Topics  . Smoking status: Never Smoker   . Smokeless tobacco: None  . Alcohol Use: Yes     Comment: socially   OB History    No data available     Review of Systems  HENT: Negative for rhinorrhea, sneezing, sore throat and trouble swallowing.   Eyes: Positive for  discharge, redness and itching. Negative for photophobia, pain and visual disturbance.  Gastrointestinal: Negative for abdominal pain.  Genitourinary: Negative for dysuria, difficulty urinating and genital sores.  Neurological: Negative for facial asymmetry.  Hematological: Negative for adenopathy.    Allergies  Septra and Tylenol  Home Medications   Prior to Admission medications   Medication Sig Start Date End Date Taking? Authorizing Provider  escitalopram (LEXAPRO) 20 MG tablet Take 20 mg by mouth daily.    Historical Provider, MD  ferrous fumarate (HEMOCYTE - 106 MG FE) 325 (106 FE) MG TABS tablet Take 1 tablet by mouth.    Historical Provider, MD  hydroxychloroquine (PLAQUENIL) 200 MG tablet Take by mouth daily.    Historical Provider, MD  Levonorgestrel-Ethinyl Estradiol (SEASONIQUE) 0.15-0.03 &0.01 MG tablet Take 1 tablet by mouth daily.    Historical Provider, MD  nitrofurantoin, macrocrystal-monohydrate, (MACROBID) 100 MG capsule Take 1 capsule (100 mg total) by mouth 2 (two) times daily. 02/13/15   Renford Dills, NP  omeprazole (PRILOSEC) 20 MG capsule Take 20 mg by mouth daily.    Historical Provider, MD  ondansetron (ZOFRAN ODT) 4 MG disintegrating tablet Take 1 tablet (4 mg total) by mouth every 8 (eight) hours as needed for nausea or vomiting. 02/13/15   Renford Dills, NP  trimethoprim-polymyxin b (POLYTRIM) ophthalmic solution Place 2 drops into the left eye every 4 (four) hours. 09/04/15 09/11/15  Arnaldo Natal, MD   Meds Ordered and Administered this Visit  Medications - No data to display  BP  115/82 mmHg  Pulse 79  Temp(Src) 97.9 F (36.6 C) (Oral)  Resp 16  Ht 5\' 1"  (1.549 m)  Wt 127 lb (57.607 kg)  BMI 24.01 kg/m2  SpO2 100%  LMP 08/20/2015 No data found.   Physical Exam  Constitutional: She is oriented to person, place, and time. She appears well-developed and well-nourished. No distress.  HENT:  Head: Normocephalic and atraumatic.  Mouth/Throat:  Oropharynx is clear and moist.  Eyes: EOM are normal. Pupils are equal, round, and reactive to light. Left eye exhibits discharge. Left conjunctiva is injected. No scleral icterus.  Neck: Normal range of motion. Neck supple. No JVD present. No tracheal deviation present. No thyromegaly present.  Cardiovascular: Normal rate, regular rhythm and normal heart sounds.  Exam reveals no gallop and no friction rub.   No murmur heard. Pulmonary/Chest: Effort normal and breath sounds normal.  Abdominal: Soft. Bowel sounds are normal. She exhibits no distension. There is no tenderness.  Musculoskeletal: Normal range of motion. She exhibits no edema.  Lymphadenopathy:    She has no cervical adenopathy.  Neurological: She is alert and oriented to person, place, and time. No cranial nerve deficit.  Skin: Skin is warm and dry.  Psychiatric: She has a normal mood and affect. Her behavior is normal. Judgment and thought content normal.  Nursing note and vitals reviewed.   ED Course  Procedures (including critical care time)  Labs Review Labs Reviewed - No data to display  Imaging Review No results found.   Visual Acuity Review  Right Eye Distance:   Left Eye Distance:   Bilateral Distance:    Right Eye Near:   Left Eye Near:    Bilateral Near:         MDM   1. Conjunctivitis of left eye    Appears to be straightforward case of bacterial conjunctivitis. However past medical history significant for lupus in which case differential diagnosis includes serositis and blepharitis due to SLE.  The patient is on Plaquenil. I've informed her to seek ophthalmologic evaluation if any change of her vision occurs. She has an appointment with her rheumatologist in 3 days.    Arnaldo NatalMichael S Ileane Sando, MD 09/04/15 1136

## 2015-09-04 NOTE — ED Notes (Signed)
Pt states no change in vision since yesterday's exam.

## 2015-09-04 NOTE — ED Notes (Signed)
Bilat eyes red and draining yellow fluids with eye pain.

## 2015-09-04 NOTE — Discharge Instructions (Signed)

## 2016-07-07 ENCOUNTER — Other Ambulatory Visit
Admission: RE | Admit: 2016-07-07 | Discharge: 2016-07-07 | Disposition: A | Discharge status: Home / Self Care | Payer: BLUE CROSS/BLUE SHIELD | Source: Ambulatory Visit | Attending: Student | Admitting: Student

## 2016-07-07 DIAGNOSIS — R197 Diarrhea, unspecified: Secondary | ICD-10-CM | POA: Diagnosis not present

## 2016-07-07 LAB — C DIFFICILE QUICK SCREEN W PCR REFLEX
C DIFFICILE (CDIFF) INTERP: NOT DETECTED
C Diff antigen: NEGATIVE
C Diff toxin: NEGATIVE

## 2016-07-07 LAB — GASTROINTESTINAL PANEL BY PCR, STOOL (REPLACES STOOL CULTURE)
Adenovirus F40/41: NOT DETECTED
Astrovirus: NOT DETECTED
CRYPTOSPORIDIUM: NOT DETECTED
CYCLOSPORA CAYETANENSIS: NOT DETECTED
Campylobacter species: NOT DETECTED
ENTAMOEBA HISTOLYTICA: NOT DETECTED
ENTEROTOXIGENIC E COLI (ETEC): NOT DETECTED
Enteroaggregative E coli (EAEC): NOT DETECTED
Enteropathogenic E coli (EPEC): NOT DETECTED
GIARDIA LAMBLIA: NOT DETECTED
Norovirus GI/GII: NOT DETECTED
Plesimonas shigelloides: NOT DETECTED
ROTAVIRUS A: NOT DETECTED
SALMONELLA SPECIES: NOT DETECTED
SHIGELLA/ENTEROINVASIVE E COLI (EIEC): NOT DETECTED
Sapovirus (I, II, IV, and V): NOT DETECTED
Shiga like toxin producing E coli (STEC): NOT DETECTED
VIBRIO CHOLERAE: NOT DETECTED
Vibrio species: NOT DETECTED
YERSINIA ENTEROCOLITICA: NOT DETECTED

## 2016-07-11 ENCOUNTER — Ambulatory Visit
Admission: RE | Admit: 2016-07-11 | Discharge: 2016-07-11 | Disposition: A | Discharge status: Home / Self Care | Payer: BLUE CROSS/BLUE SHIELD | Source: Ambulatory Visit | Attending: Physician Assistant | Admitting: Physician Assistant

## 2016-07-11 DIAGNOSIS — K625 Hemorrhage of anus and rectum: Secondary | ICD-10-CM | POA: Insufficient documentation

## 2016-07-11 DIAGNOSIS — R1084 Generalized abdominal pain: Secondary | ICD-10-CM | POA: Diagnosis present

## 2016-07-11 HISTORY — DX: Systemic involvement of connective tissue, unspecified: M35.9

## 2016-07-11 MED ORDER — IOPAMIDOL (ISOVUE-300) INJECTION 61%
100.0000 mL | Freq: Once | INTRAVENOUS | Status: AC | PRN
  Administered 2016-07-11: 100 mL via INTRAVENOUS

## 2016-07-17 ENCOUNTER — Encounter: Payer: Self-pay | Admitting: *Deleted

## 2016-07-18 ENCOUNTER — Ambulatory Visit
Admission: RE | Admit: 2016-07-18 | Discharge: 2016-07-18 | Disposition: A | Discharge status: Home / Self Care | Payer: BLUE CROSS/BLUE SHIELD | Source: Ambulatory Visit | Attending: Unknown Physician Specialty | Admitting: Unknown Physician Specialty

## 2016-07-18 ENCOUNTER — Ambulatory Visit: Payer: BLUE CROSS/BLUE SHIELD | Admitting: Anesthesiology

## 2016-07-18 ENCOUNTER — Encounter
Admission: RE | Disposition: A | Discharge status: Home / Self Care | Payer: Self-pay | Source: Ambulatory Visit | Attending: Unknown Physician Specialty

## 2016-07-18 ENCOUNTER — Encounter: Payer: Self-pay | Admitting: *Deleted

## 2016-07-18 DIAGNOSIS — J45909 Unspecified asthma, uncomplicated: Secondary | ICD-10-CM | POA: Insufficient documentation

## 2016-07-18 DIAGNOSIS — K210 Gastro-esophageal reflux disease with esophagitis: Secondary | ICD-10-CM | POA: Insufficient documentation

## 2016-07-18 DIAGNOSIS — F419 Anxiety disorder, unspecified: Secondary | ICD-10-CM | POA: Diagnosis not present

## 2016-07-18 DIAGNOSIS — R1013 Epigastric pain: Secondary | ICD-10-CM | POA: Insufficient documentation

## 2016-07-18 DIAGNOSIS — Z87891 Personal history of nicotine dependence: Secondary | ICD-10-CM | POA: Diagnosis not present

## 2016-07-18 DIAGNOSIS — M329 Systemic lupus erythematosus, unspecified: Secondary | ICD-10-CM | POA: Diagnosis not present

## 2016-07-18 DIAGNOSIS — K589 Irritable bowel syndrome without diarrhea: Secondary | ICD-10-CM | POA: Diagnosis not present

## 2016-07-18 DIAGNOSIS — K648 Other hemorrhoids: Secondary | ICD-10-CM | POA: Diagnosis not present

## 2016-07-18 HISTORY — DX: Palpitations: R00.2

## 2016-07-18 HISTORY — DX: Ectopic kidney: Q63.2

## 2016-07-18 HISTORY — DX: Dyspnea, unspecified: R06.00

## 2016-07-18 HISTORY — DX: Unspecified asthma, uncomplicated: J45.909

## 2016-07-18 HISTORY — DX: Anemia, unspecified: D64.9

## 2016-07-18 HISTORY — DX: Other complications of anesthesia, initial encounter: T88.59XA

## 2016-07-18 HISTORY — DX: Adverse effect of unspecified anesthetic, initial encounter: T41.45XA

## 2016-07-18 HISTORY — PX: ESOPHAGOGASTRODUODENOSCOPY (EGD) WITH PROPOFOL: SHX5813

## 2016-07-18 HISTORY — PX: COLONOSCOPY WITH PROPOFOL: SHX5780

## 2016-07-18 HISTORY — DX: Gastro-esophageal reflux disease without esophagitis: K21.9

## 2016-07-18 HISTORY — DX: Anxiety disorder, unspecified: F41.9

## 2016-07-18 HISTORY — DX: Cardiac arrhythmia, unspecified: I49.9

## 2016-07-18 LAB — POCT PREGNANCY, URINE: PREG TEST UR: NEGATIVE

## 2016-07-18 SURGERY — COLONOSCOPY WITH PROPOFOL
Anesthesia: General

## 2016-07-18 MED ORDER — MIDAZOLAM HCL 2 MG/2ML IJ SOLN
INTRAMUSCULAR | Status: AC
  Filled 2016-07-18: qty 2

## 2016-07-18 MED ORDER — FENTANYL CITRATE (PF) 100 MCG/2ML IJ SOLN
INTRAMUSCULAR | Status: AC
  Filled 2016-07-18: qty 2

## 2016-07-18 MED ORDER — PROPOFOL 10 MG/ML IV BOLUS
INTRAVENOUS | Status: AC
  Filled 2016-07-18: qty 20

## 2016-07-18 MED ORDER — SODIUM CHLORIDE 0.9 % IV SOLN: INTRAVENOUS | Status: DC

## 2016-07-18 MED ORDER — LIDOCAINE HCL (PF) 1 % IJ SOLN
INTRAMUSCULAR | Status: AC
  Filled 2016-07-18: qty 2

## 2016-07-18 MED ORDER — ONDANSETRON HCL 4 MG/2ML IJ SOLN
INTRAMUSCULAR | Status: DC | PRN
  Administered 2016-07-18: 4 mg via INTRAVENOUS

## 2016-07-18 MED ORDER — DEXAMETHASONE SODIUM PHOSPHATE 10 MG/ML IJ SOLN
INTRAMUSCULAR | Status: AC
  Filled 2016-07-18: qty 1

## 2016-07-18 MED ORDER — GLYCOPYRROLATE 0.2 MG/ML IJ SOLN
INTRAMUSCULAR | Status: AC
  Filled 2016-07-18: qty 1

## 2016-07-18 MED ORDER — FENTANYL CITRATE (PF) 100 MCG/2ML IJ SOLN
INTRAMUSCULAR | Status: DC | PRN
  Administered 2016-07-18 (×2): 50 ug via INTRAVENOUS

## 2016-07-18 MED ORDER — IPRATROPIUM-ALBUTEROL 0.5-2.5 (3) MG/3ML IN SOLN: 3.0000 mL | Freq: Once | RESPIRATORY_TRACT | Status: DC

## 2016-07-18 MED ORDER — LIDOCAINE HCL (PF) 2 % IJ SOLN
INTRAMUSCULAR | Status: AC
  Filled 2016-07-18: qty 2

## 2016-07-18 MED ORDER — PROPOFOL 500 MG/50ML IV EMUL
INTRAVENOUS | Status: DC | PRN
  Administered 2016-07-18: 120 ug/kg/min via INTRAVENOUS

## 2016-07-18 MED ORDER — ONDANSETRON HCL 4 MG/2ML IJ SOLN
INTRAMUSCULAR | Status: AC
  Filled 2016-07-18: qty 2

## 2016-07-18 MED ORDER — DEXAMETHASONE SODIUM PHOSPHATE 10 MG/ML IJ SOLN
INTRAMUSCULAR | Status: DC | PRN
  Administered 2016-07-18: 5 mg via INTRAVENOUS

## 2016-07-18 MED ORDER — SODIUM CHLORIDE 0.9 % IV SOLN
INTRAVENOUS | Status: DC
  Administered 2016-07-18: 09:00:00 via INTRAVENOUS

## 2016-07-18 MED ORDER — LIDOCAINE HCL (CARDIAC) 20 MG/ML IV SOLN
INTRAVENOUS | Status: DC | PRN
  Administered 2016-07-18: 30 mg via INTRAVENOUS

## 2016-07-18 NOTE — Op Note (Signed)
[facility]enterology Patient Name: Joann Jones Procedure Date: 07/18/2016 8:50 AM MRN: [id number] Account #: [id number] Date of Birth: 12-13-1985 Admit Type: Outpatient Age: 31 Room: [facility] ENDO ROOM 3 Gender: Female Note Status: Finalized Procedure:            Colonoscopy Indications:          Clinically significant diarrhea of unexplained origin Providers:            Scot Jun, MD Medicines:            Propofol per Anesthesia Complications:        No immediate complications. Procedure:            Pre-Anesthesia Assessment:                       - After reviewing the risks and benefits, the patient                        was deemed in satisfactory condition to undergo the                        procedure.                       After obtaining informed consent, the colonoscope was                        passed under direct vision. Throughout the procedure,                        the patient's blood pressure, pulse, and oxygen                        saturations were monitored continuously. The                        Colonoscope was introduced through the anus and                        advanced to the the cecum, identified by appendiceal                        orifice and ileocecal valve. The colonoscopy was                        performed without difficulty. The patient tolerated the                        procedure well. The quality of the bowel preparation                        was excellent. Findings:      Internal hemorrhoids were found during endoscopy. The hemorrhoids were       small and Grade I (internal hemorrhoids that do not prolapse).      The colon (entire examined portion) appeared normal. Biopsies were taken       with a cold forceps for histology. Due to diarrhea these were done from       ascending,transverse, descending and sigmoid colon to check for       microscopic colitis. The terminal ileum was examined and was  normal. Impression:           -  Internal hemorrhoids.                       - The entire examined colon is normal. Biopsied. Recommendation:       - Await pathology results. Scot Jun, MD 07/18/2016 10:11:20 AM This report has been signed electronically. Number of Addenda: 0 Note Initiated On: 07/18/2016 8:50 AM Total Procedure Duration: 0 hours 20 minutes 55 seconds       [facility]

## 2016-07-18 NOTE — Anesthesia Preprocedure Evaluation (Signed)
Anesthesia Evaluation  Patient identified by MRN, date of birth, ID band Patient awake    Reviewed: Allergy & Precautions, H&P , NPO status , Patient's Chart, lab work & pertinent test results  History of Anesthesia Complications (+) PROLONGED EMERGENCE and history of anesthetic complications  Airway Mallampati: III  TM Distance: >3 FB Neck ROM: full    Dental  (+) Chipped, Caps   Pulmonary neg shortness of breath, asthma , former smoker,    Pulmonary exam normal breath sounds clear to auscultation       Cardiovascular Exercise Tolerance: Good (-) angina(-) Past MI and (-) DOE Normal cardiovascular exam+ dysrhythmias  Rhythm:regular Rate:Normal     Neuro/Psych  Headaches, Anxiety negative psych ROS   GI/Hepatic negative GI ROS, Neg liver ROS, GERD  Controlled,  Endo/Other  negative endocrine ROS  Renal/GU Renal disease  negative genitourinary   Musculoskeletal   Abdominal   Peds  Hematology negative hematology ROS (+)   Anesthesia Other Findings Past Medical History: No date: Anemia No date: Anxiety No date: Asthma     Comment: with exertion No date: Collagen vascular disease (HCC) No date: Complication of anesthesia     Comment: hard to wake up No date: Dyspnea No date: Dysrhythmia     Comment: tachycardia No date: GERD (gastroesophageal reflux disease) No date: IBS (irritable bowel syndrome) No date: ITP (idiopathic thrombocytopenic purpura) No date: Lupus No date: Migraines No date: Palpitations No date: Pelvic kidney  Past Surgical History: No date: HERNIA REPAIR No date: WRIST SURGERY Right     Reproductive/Obstetrics negative OB ROS                             Anesthesia Physical Anesthesia Plan  ASA: III  Anesthesia Plan: General   Post-op Pain Management:    Induction:   Airway Management Planned:   Additional Equipment:   Intra-op Plan:    Post-operative Plan:   Informed Consent: I have reviewed the patients History and Physical, chart, labs and discussed the procedure including the risks, benefits and alternatives for the proposed anesthesia with the patient or authorized representative who has indicated his/her understanding and acceptance.   Dental Advisory Given  Plan Discussed with: Anesthesiologist, CRNA and Surgeon  Anesthesia Plan Comments: (Patient declines breathing treatment.  Patient requests that we avoid benzodiazepines )        Anesthesia Quick Evaluation

## 2016-07-18 NOTE — Transfer of Care (Signed)
Immediate Anesthesia Transfer of Care Note  Patient: Joann Jones  Procedure(s) Performed: Procedure(s): COLONOSCOPY WITH PROPOFOL (N/A) ESOPHAGOGASTRODUODENOSCOPY (EGD) WITH PROPOFOL (N/A)  Patient Location: PACU  Anesthesia Type:General  Level of Consciousness: awake and sedated  Airway & Oxygen Therapy: Patient Spontanous Breathing and Patient connected to nasal cannula oxygen  Post-op Assessment: Report given to RN and Post -op Vital signs reviewed and stable  Post vital signs: Reviewed and stable  Last Vitals:  Vitals:   07/18/16 0848  BP: (!) 127/91  Pulse: (!) 53  Resp: 16  Temp: 36.7 C    Last Pain:  Vitals:   07/18/16 0848  TempSrc: Tympanic         Complications: No apparent anesthesia complications

## 2016-07-18 NOTE — Anesthesia Post-op Follow-up Note (Cosign Needed)
Anesthesia QCDR form completed.        

## 2016-07-18 NOTE — H&P (Signed)
Primary Care Physician:  Leotis Shames, MD Primary Gastroenterologist:  Dr. Mechele Collin  Pre-Procedure History & Physical: HPI:  Joann Jones is a 31 y.o. female is here for an endoscopy and colonoscopy.   Past Medical History:  Diagnosis Date  . Anemia   . Anxiety   . Asthma    with exertion  . Collagen vascular disease (HCC)   . Complication of anesthesia    hard to wake up  . Dyspnea   . Dysrhythmia    tachycardia  . GERD (gastroesophageal reflux disease)   . IBS (irritable bowel syndrome)   . ITP (idiopathic thrombocytopenic purpura)   . Lupus   . Migraines   . Palpitations   . Pelvic kidney     Past Surgical History:  Procedure Laterality Date  . HERNIA REPAIR    . WRIST SURGERY Right     Prior to Admission medications   Medication Sig Start Date End Date Taking? Authorizing Provider  ALPRAZolam Prudy Feeler) 0.25 MG tablet Take 0.25 mg by mouth at bedtime as needed for anxiety.   Yes Historical Provider, MD  cyanocobalamin 1000 MCG tablet Take 1,000 mcg by mouth daily.   Yes Historical Provider, MD  escitalopram (LEXAPRO) 20 MG tablet Take 20 mg by mouth daily.   Yes Historical Provider, MD  ferrous fumarate (HEMOCYTE - 106 MG FE) 325 (106 FE) MG TABS tablet Take 1 tablet by mouth.   Yes Historical Provider, MD  hydroxychloroquine (PLAQUENIL) 200 MG tablet Take by mouth daily.   Yes Historical Provider, MD  Levonorgestrel-Ethinyl Estradiol (SEASONIQUE) 0.15-0.03 &0.01 MG tablet Take 1 tablet by mouth daily.   Yes Historical Provider, MD  modafinil (PROVIGIL) 200 MG tablet Take 200 mg by mouth daily.   Yes Historical Provider, MD  omeprazole (PRILOSEC) 20 MG capsule Take 20 mg by mouth daily.   Yes Historical Provider, MD  zolmitriptan (ZOMIG-ZMT) 2.5 MG disintegrating tablet Take 2.5 mg by mouth as needed for migraine.   Yes Historical Provider, MD  cyclobenzaprine (FLEXERIL) 10 MG tablet Take 10 mg by mouth 3 (three) times daily as needed for muscle spasms.     Historical Provider, MD  nitrofurantoin, macrocrystal-monohydrate, (MACROBID) 100 MG capsule Take 1 capsule (100 mg total) by mouth 2 (two) times daily. Patient not taking: Reported on 07/18/2016 02/13/15   Renford Dills, NP  ondansetron (ZOFRAN ODT) 4 MG disintegrating tablet Take 1 tablet (4 mg total) by mouth every 8 (eight) hours as needed for nausea or vomiting. 02/13/15   Renford Dills, NP    Allergies as of 07/14/2016 - Review Complete 07/11/2016  Allergen Reaction Noted  . Septra [sulfamethoxazole-trimethoprim] Other (See Comments) 02/13/2015  . Tylenol [acetaminophen] Nausea And Vomiting 02/13/2015    Family History  Problem Relation Age of Onset  . Hypertension Mother   . Hyperlipidemia Mother   . Hypertension Father   . Hyperlipidemia Father     Social History   Social History  . Marital status: Married    Spouse name: N/A  . Number of children: N/A  . Years of education: N/A   Occupational History  . Not on file.   Social History Main Topics  . Smoking status: Former Smoker    Quit date: 10/21/2005  . Smokeless tobacco: Never Used  . Alcohol use Yes     Comment: socially  . Drug use: No  . Sexual activity: Not on file   Other Topics Concern  . Not on file   Social History Narrative  .  No narrative on file    Review of Systems: See HPI, otherwise negative ROS  Physical Exam: BP (!) 127/91   Pulse (!) 53   Temp 98.1 F (36.7 C) (Tympanic)   Resp 16   Ht  (1.549 m)   Wt 58.5 kg (129 lb)   SpO2 100%   BMI 24.37 kg/m  General:   Alert,  pleasant and cooperative in NAD Head:  Normocephalic and atraumatic. Neck:  Supple; no masses or thyromegaly. Lungs:  Clear throughout to auscultation.    Heart:  Regular rate and rhythm. Abdomen:  Soft, nontender and nondistended. Normal bowel sounds, without guarding, and without rebound.   Neurologic:  Alert and  oriented x4;  grossly normal neurologically.  Impression/Plan: Joann Jones is here for  an endoscopy and colonoscopy to be performed for diarrhea, abdominal pain.  Risks, benefits, limitations, and alternatives regarding  endoscopy and colonoscopy have been reviewed with the patient.  Questions have been answered.  All parties agreeable.   Lynnae Prude, MD  07/18/2016, 9:22 AM

## 2016-07-18 NOTE — Anesthesia Procedure Notes (Signed)
Performed by: COOK-MARTIN, Maleia Weems Pre-anesthesia Checklist: Patient identified, Suction available, Emergency Drugs available, Patient being monitored and Timeout performed Patient Re-evaluated:Patient Re-evaluated prior to inductionOxygen Delivery Method: Nasal cannula Preoxygenation: Pre-oxygenation with 100% oxygen Intubation Type: IV induction Airway Equipment and Method: Bite block Placement Confirmation: positive ETCO2 and CO2 detector     

## 2016-07-18 NOTE — Op Note (Signed)
[facility] Patient Name: Joann Jones Procedure Date: 07/18/2016 8:50 AM MRN: [id number] Account #: [id number] Date of Birth: 05-14-1985 Admit Type: Outpatient Age: 31 Room: [facility] ENDO ROOM 3 Gender: Female Note Status: Finalized Procedure:            Upper GI endoscopy Indications:          Epigastric abdominal pain Providers:            Scot Jun, MD Referring MD:         Leotis Shames (Referring MD) Medicines:            Propofol per Anesthesia Complications:        No immediate complications. Procedure:            Pre-Anesthesia Assessment:                       - After reviewing the risks and benefits, the patient                        was deemed in satisfactory condition to undergo the                        procedure.                       After obtaining informed consent, the endoscope was                        passed under direct vision. Throughout the procedure,                        the patient's blood pressure, pulse, and oxygen                        saturations were monitored continuously. The Endoscope                        was introduced through the mouth, and advanced to the                        second part of duodenum. The upper GI endoscopy was                        accomplished without difficulty. The patient tolerated                        the procedure well. Findings:      LA Grade A (one or more mucosal breaks less than 5 mm, not extending       between tops of 2 mucosal folds) esophagitis with no bleeding was found       35 cm from the incisors. Biopsies were taken with a cold forceps for       histology.      Diffuse mild inflammation characterized by erythema and slight       granularity was found in the gastric body and in the gastric antrum.       Biopsies were taken with a cold forceps for Helicobacter pylori testing.      The examined duodenum was normal. Impression:           - LA Grade A reflux  esophagitis. Rule  out Barrett's                        esophagus. Biopsied.                       - Gastritis. Biopsied.                       - Normal examined duodenum. Recommendation:       - Await pathology results.                       - Perform a colonoscopy as previously scheduled. Scot Jun, MD 07/18/2016 9:43:42 AM This report has been signed electronically. Number of Addenda: 0 Note Initiated On: 07/18/2016 8:50 AM      [facility]

## 2016-07-18 NOTE — Anesthesia Postprocedure Evaluation (Signed)
Anesthesia Post Note  Patient: Joann Jones  Procedure(s) Performed: Procedure(s) (LRB): COLONOSCOPY WITH PROPOFOL (N/A) ESOPHAGOGASTRODUODENOSCOPY (EGD) WITH PROPOFOL (N/A)  Patient location during evaluation: Endoscopy Anesthesia Type: General Level of consciousness: awake and alert Pain management: pain level controlled Vital Signs Assessment: post-procedure vital signs reviewed and stable Respiratory status: spontaneous breathing, nonlabored ventilation, respiratory function stable and patient connected to nasal cannula oxygen Cardiovascular status: blood pressure returned to baseline and stable Postop Assessment: no signs of nausea or vomiting Anesthetic complications: no     Last Vitals:  Vitals:   07/18/16 1032 07/18/16 1052  BP: 101/71 119/75  Pulse: 92 71  Resp: 19 (!) 21  Temp:      Last Pain:  Vitals:   07/18/16 1012  TempSrc: Tympanic                 Cleda Mccreedy Burley [place]opka

## 2016-07-21 ENCOUNTER — Encounter: Payer: Self-pay | Admitting: Unknown Physician Specialty

## 2016-07-21 LAB — SURGICAL PATHOLOGY

## 2016-12-03 ENCOUNTER — Encounter: Payer: Self-pay | Admitting: General Surgery

## 2016-12-05 ENCOUNTER — Encounter: Payer: Self-pay | Admitting: *Deleted

## 2016-12-09 ENCOUNTER — Encounter: Payer: Self-pay | Admitting: General Surgery

## 2016-12-09 ENCOUNTER — Ambulatory Visit (INDEPENDENT_AMBULATORY_CARE_PROVIDER_SITE_OTHER): Payer: BLUE CROSS/BLUE SHIELD | Admitting: General Surgery

## 2016-12-09 VITALS — BP 124/64 | HR 106 | Resp 12 | Ht 61.0 in | Wt 134.0 lb

## 2016-12-09 DIAGNOSIS — K648 Other hemorrhoids: Secondary | ICD-10-CM | POA: Diagnosis not present

## 2016-12-09 MED ORDER — HYDROCORTISONE ACE-PRAMOXINE 2.5-1 % RE CREA: 1.0000 "application " | TOPICAL_CREAM | Freq: Every day | RECTAL | 0 refills | Status: DC

## 2016-12-09 NOTE — Patient Instructions (Addendum)
Patient to return in one month. Rx sent  The patient is aware to call back for any questions or concerns.

## 2016-12-09 NOTE — Progress Notes (Signed)
Patient ID: Joann Jones, female   DOB: 07/24/1985, 31 y.o.   MRN: [id number]  Chief Complaint  Patient presents with  . Hemorrhoids    HPI Joann Jones is a 31 y.o. female here today for a evaluation of hemorrhoids. Patient had hemorrhoid for 2 year. Patient states off and on she sees bright red blood on the paper not in bowll. Lots of itching. Moves her bowels daily now but has had problems with constipatioon and IBS. Has had colonoscopy twice.. She thinks sometimes hemorrhoids push out. HPI  Past Medical History:  Diagnosis Date  . Anemia   . Anxiety   . Asthma    with exertion  . Collagen vascular disease (HCC)   . Complication of anesthesia    hard to wake up  . Dyspnea   . Dysrhythmia    tachycardia  . GERD (gastroesophageal reflux disease)   . IBS (irritable bowel syndrome)   . ITP (idiopathic thrombocytopenic purpura)   . Lupus   . Migraines   . Palpitations   . Pelvic kidney     Past Surgical History:  Procedure Laterality Date  . COLONOSCOPY WITH PROPOFOL N/A 07/18/2016   Procedure: COLONOSCOPY WITH PROPOFOL;  Surgeon: Scot Junobert T Elliott, MD;  [facility];  Service: Endoscopy;  Laterality: N/A;  . ESOPHAGOGASTRODUODENOSCOPY (EGD) WITH PROPOFOL N/A 07/18/2016   Procedure: ESOPHAGOGASTRODUODENOSCOPY (EGD) WITH PROPOFOL;  Surgeon: Scot Junobert T Elliott, MD;  [facility];  Service: Endoscopy;  Laterality: N/A;  . HERNIA REPAIR    . WRIST SURGERY Right     Family History  Problem Relation Age of Onset  . Hypertension Mother   . Hyperlipidemia Mother   . Hypertension Father   . Hyperlipidemia Father     Social History Social History  Substance Use Topics  . Smoking status: Former Smoker    Quit date: 10/21/2005  . Smokeless tobacco: Never Used  . Alcohol use Yes     Comment: socially    Allergies  Allergen Reactions  . Codeine   . Septra [Sulfamethoxazole-Trimethoprim] Other (See Comments)    Decrease platlets  . Tylenol  [Acetaminophen] Nausea And Vomiting    Tylenol #3    Current Outpatient Prescriptions  Medication Sig Dispense Refill  . ALPRAZolam (XANAX) 0.25 MG tablet Take 0.25 mg by mouth at bedtime as needed for anxiety.    . cyanocobalamin 1000 MCG tablet Take 1,000 mcg by mouth daily.    . cyclobenzaprine (FLEXERIL) 10 MG tablet Take 10 mg by mouth 3 (three) times daily as needed for muscle spasms.    Marland Kitchen. escitalopram (LEXAPRO) 20 MG tablet Take 20 mg by mouth daily.    . ferrous fumarate (HEMOCYTE - 106 MG FE) 325 (106 FE) MG TABS tablet Take 1 tablet by mouth.    . hydroxychloroquine (PLAQUENIL) 200 MG tablet Take by mouth daily.    . modafinil (PROVIGIL) 200 MG tablet Take 200 mg by mouth daily.    Marland Kitchen. omeprazole (PRILOSEC) 20 MG capsule Take 20 mg by mouth daily.    . hydrocortisone-pramoxine (ANALPRAM HC) 2.5-1 % rectal cream Place 1 application rectally daily. 30 Jones 0   No current facility-administered medications for this visit.     Review of Systems Review of Systems  Constitutional: Negative.   Respiratory: Negative.   Cardiovascular: Negative.     Blood pressure 124/64, pulse (!) 106, resp. rate 12, height 5\' 1"  (1.549 m), weight 134 lb (60.8 kg).  Physical Exam Physical Exam  Constitutional: She  is oriented to person, place, and time. She appears well-developed and well-nourished.  Eyes: Conjunctivae are normal. No scleral icterus.  Cardiovascular: Normal rate, regular rhythm and normal heart sounds.   Pulmonary/Chest: Effort normal and breath sounds normal.  Abdominal: Soft. Normal appearance and bowel sounds are normal. There is no hepatomegaly. There is no tenderness. No hernia.  Genitourinary: Rectal exam shows internal hemorrhoid. Rectal exam shows no external hemorrhoid, no fissure, no tenderness and anal tone normal.  Genitourinary Comments: DRE was normal With consent anoscopy was performed. Small internal hemorrhoids seen. No active bleeding, none tending to prolapse.   Neurological: She is alert and oriented to person, place, and time.  Skin: Skin is warm and dry.    Data Reviewed Notes reviewed   Assessment      internal hemorrhoids, pruritus ani Plan   Pt advised on findings Patient to return in one month.  Rx sent -Analpram 2.5%, one 30g tube. To use once or twice daily The patient is aware to call back for any questions or concerns.  HPI, Physical Exam, Assessment and Plan have been scribed under the direction and in the presence of Kathreen Cosier, MD  Joann Jones, CMA     HPI, Physical Exam, Assessment and Plan have been scribed under the direction and in the presence of Kathreen Cosier, MD  Joann Jones, CMA \ I have completed the exam and reviewed the above documentation for accuracy and completeness.  I agree with the above.  Museum/gallery conservator has been used and any errors in dictation or transcription are unintentional.  Joann Jones, M.D., F.A.C.S.   Joann Jones 12/09/2016, 4:45 PM

## 2017-01-08 ENCOUNTER — Ambulatory Visit: Payer: BLUE CROSS/BLUE SHIELD | Admitting: General Surgery

## 2017-02-14 ENCOUNTER — Ambulatory Visit
Admission: EM | Admit: 2017-02-14 | Discharge: 2017-02-14 | Disposition: A | Discharge status: Home / Self Care | Payer: BLUE CROSS/BLUE SHIELD | Attending: Family Medicine | Admitting: Family Medicine

## 2017-02-14 ENCOUNTER — Encounter: Payer: Self-pay | Admitting: Gynecology

## 2017-02-14 DIAGNOSIS — R69 Illness, unspecified: Secondary | ICD-10-CM | POA: Diagnosis not present

## 2017-02-14 DIAGNOSIS — J029 Acute pharyngitis, unspecified: Secondary | ICD-10-CM | POA: Diagnosis not present

## 2017-02-14 DIAGNOSIS — R110 Nausea: Secondary | ICD-10-CM | POA: Diagnosis not present

## 2017-02-14 DIAGNOSIS — J111 Influenza due to unidentified influenza virus with other respiratory manifestations: Secondary | ICD-10-CM

## 2017-02-14 DIAGNOSIS — R05 Cough: Secondary | ICD-10-CM | POA: Diagnosis not present

## 2017-02-14 LAB — RAPID STREP SCREEN (MED CTR MEBANE ONLY): Streptococcus, Group A Screen (Direct): NEGATIVE

## 2017-02-14 MED ORDER — AZITHROMYCIN 250 MG PO TABS: 250.0000 mg | ORAL_TABLET | Freq: Every day | ORAL | 0 refills | Status: DC

## 2017-02-14 MED ORDER — OSELTAMIVIR PHOSPHATE 75 MG PO CAPS: 75.0000 mg | ORAL_CAPSULE | Freq: Two times a day (BID) | ORAL | 0 refills | Status: DC

## 2017-02-14 NOTE — ED Provider Notes (Signed)
[facility]    CSN: [id number] Arrival date & time: 02/14/17  0947     History   Chief Complaint Chief Complaint  Patient presents with  . Sore Throat    HPI Joann Jones is a 31 y.o. female.   Patient is a 31 year old female who presents with complaint of flulike symptoms. Patient states she started having some cold symptoms on Thursday but last night began having body aches, chills, nausea, sore throat, and cough. States she had a fever 100.3 this morning she took some NyQuil last night as well as as a shot of moonshine with minimal relief. Patient works as a CMA in urgent care and also reports that her daughter has been sick as well. She did get a flu shot in October.  No chest pain, no shortness of breath.      Past Medical History:  Diagnosis Date  . Anemia   . Anxiety   . Asthma    with exertion  . Collagen vascular disease (HCC)   . Complication of anesthesia    hard to wake up  . Dyspnea   . Dysrhythmia    tachycardia  . GERD (gastroesophageal reflux disease)   . IBS (irritable bowel syndrome)   . ITP (idiopathic thrombocytopenic purpura)   . Lupus   . Migraines   . Palpitations   . Pelvic kidney     There are no active problems to display for this patient.   Past Surgical History:  Procedure Laterality Date  . HERNIA REPAIR    . WRIST SURGERY Right     OB History    No data available       Home Medications    Prior to Admission medications   Medication Sig Start Date End Date Taking? Authorizing Provider  ALPRAZolam Prudy Feeler(XANAX) 0.25 MG tablet Take 0.25 mg by mouth at bedtime as needed for anxiety.   Yes [provider]  escitalopram (LEXAPRO) 20 MG tablet Take 20 mg by mouth daily.   Yes [provider]  hydroxychloroquine (PLAQUENIL) 200 MG tablet Take by mouth daily.   Yes [provider]  metoCLOPramide (REGLAN) 10 MG tablet Take 10 mg 4 (four) times daily by mouth.   Yes [provider]    azithromycin (ZITHROMAX Z-PAK) 250 MG tablet Take 1 tablet (250 mg total) daily by mouth. 02/14/17   Candis SchatzHarris, Kirandeep Fariss D, PA-C  cyclobenzaprine (FLEXERIL) 10 MG tablet Take 10 mg by mouth 3 (three) times daily as needed for muscle spasms.    [provider]  modafinil (PROVIGIL) 200 MG tablet Take 200 mg by mouth daily.    [provider]  oseltamivir (TAMIFLU) 75 MG capsule Take 1 capsule (75 mg total) every 12 (twelve) hours by mouth. 02/14/17   Candis SchatzHarris, Tay Whitwell D, PA-C    Family History Family History  Problem Relation Age of Onset  . Hypertension Mother   . Hyperlipidemia Mother   . Hypertension Father   . Hyperlipidemia Father     Social History Social History   Tobacco Use  . Smoking status: Former Smoker    Last attempt to quit: 10/21/2005    Years since quitting: 11.3  . Smokeless tobacco: Never Used  Substance Use Topics  . Alcohol use: Yes    Comment: socially  . Drug use: No     Allergies   Codeine; Septra [sulfamethoxazole-trimethoprim]; and Tylenol [acetaminophen]   Review of Systems Review of Systems  As noted above in history of present  illness. Other system reviewed and found to be negative.   Physical Exam Triage Vital Signs ED Triage Vitals  Enc Vitals Group     BP 02/14/17 1026 110/79     Pulse Rate 02/14/17 1026 (!) 122     Resp 02/14/17 1026 16     Temp 02/14/17 1026 99.8 F (37.7 C)     Temp Source 02/14/17 1026 Oral     SpO2 02/14/17 1026 100 %     Weight 02/14/17 1029 147 lb (66.7 kg)     Height 02/14/17 1029 5\' 4"  (1.626 m)     Head Circumference --      Peak Flow --      Pain Score 02/14/17 1029 8     Pain Loc --      Pain Edu? --      Excl. in GC? --    No data found.  Updated Vital Signs BP 110/79 (BP Location: Left Arm)   Pulse (!) 122   Temp 99.8 F (37.7 C) (Oral)   Resp 16   Ht 5\' 4"  (1.626 m)   Wt 147 lb (66.7 kg)   SpO2 100%   BMI 25.23 kg/m   Physical Exam  Constitutional: She appears  well-developed and well-nourished. She appears ill.  HENT:  Head: Normocephalic and atraumatic.  Right Ear: Tympanic membrane and ear canal normal.  Left Ear: Tympanic membrane and ear canal normal.  Mouth/Throat: Mucous membranes are normal. Posterior oropharyngeal erythema present. No tonsillar exudate.  Some postnasal drainage. Tenderness to cervical nodes.  Eyes: EOM are normal. Pupils are equal, round, and reactive to light.  Cardiovascular: Regular rhythm. Exam reveals no gallop.  No murmur heard. Tachycardic rate  Pulmonary/Chest: Effort normal and breath sounds normal. No respiratory distress. She has no wheezes. She has no rhonchi. She has no rales.  Abdominal: Soft. Bowel sounds are normal.  Lymphadenopathy:    She has cervical adenopathy.  Skin: Skin is warm and dry. Capillary refill takes less than 2 seconds.     UC Treatments / Results  Labs (all labs ordered are listed, but only abnormal results are displayed) Labs Reviewed  RAPID STREP SCREEN (NOT AT [facility])  CULTURE, GROUP A STREP [facility])    EKG  EKG Interpretation None       Radiology No results found.  Procedures Procedures (including critical care time)  Medications Ordered in UC Medications - No data to display   Initial Impression / Assessment and Plan / UC Course  I have reviewed the triage vital signs and the nursing notes.  Pertinent labs & imaging results that were available during my care of the patient were reviewed by me and considered in my medical decision making (see chart for details).     Patient of flulike symptoms that started with cold symptoms on Thursday but body aches, chills nausea, and fever began last night. Patient works as a Clinical biochemist and urgent care setting.  Final Clinical Impressions(s) / UC Diagnoses   Final diagnoses:  Influenza-like illness    ED Discharge Orders        Ordered    oseltamivir (TAMIFLU) 75 MG capsule  Every 12 hours     02/14/17 1055     azithromycin (ZITHROMAX Z-PAK) 250 MG tablet  Daily     02/14/17 1057     Will treat with a course of Tamiflu. 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  Controlled Substance Prescriptions [place] Controlled Substance Registry consulted? Not Applicable   Candis SchatzHarris, Deann Mclaine D, PA-C 02/14/17 1059

## 2017-02-14 NOTE — Discharge Instructions (Signed)
-  Tamiflu: one tablet twice a day until gone -push fluids -rest -Tylenol and ibuprofen for pain -if no improvement in a few days, would start azithromycin as directed. -follow up as needed

## 2017-02-14 NOTE — ED Triage Notes (Signed)
Per patient c/o cough / sore throat/ fever 103 x today.

## 2017-02-17 LAB — CULTURE, GROUP A STREP (THRC)

## 2017-03-06 ENCOUNTER — Other Ambulatory Visit: Payer: Self-pay | Admitting: Internal Medicine

## 2017-03-13 ENCOUNTER — Other Ambulatory Visit: Payer: Self-pay | Admitting: Internal Medicine

## 2017-03-13 DIAGNOSIS — R102 Pelvic and perineal pain: Secondary | ICD-10-CM

## 2017-03-20 ENCOUNTER — Ambulatory Visit
Admission: RE | Admit: 2017-03-20 | Discharge: 2017-03-20 | Disposition: A | Discharge status: Home / Self Care | Payer: BLUE CROSS/BLUE SHIELD | Source: Ambulatory Visit | Attending: Internal Medicine | Admitting: Internal Medicine

## 2017-03-20 DIAGNOSIS — R102 Pelvic and perineal pain: Secondary | ICD-10-CM | POA: Insufficient documentation

## 2017-03-25 ENCOUNTER — Other Ambulatory Visit: Payer: Self-pay | Admitting: Internal Medicine

## 2017-03-25 DIAGNOSIS — R102 Pelvic and perineal pain: Secondary | ICD-10-CM

## 2017-07-27 ENCOUNTER — Encounter: Payer: Self-pay | Admitting: Emergency Medicine

## 2017-07-27 ENCOUNTER — Ambulatory Visit
Admission: EM | Admit: 2017-07-27 | Discharge: 2017-07-27 | Disposition: A | Discharge status: Home / Self Care | Payer: BLUE CROSS/BLUE SHIELD | Attending: Emergency Medicine | Admitting: Emergency Medicine

## 2017-07-27 ENCOUNTER — Other Ambulatory Visit: Payer: Self-pay

## 2017-07-27 DIAGNOSIS — R0981 Nasal congestion: Secondary | ICD-10-CM | POA: Diagnosis not present

## 2017-07-27 DIAGNOSIS — R509 Fever, unspecified: Secondary | ICD-10-CM

## 2017-07-27 DIAGNOSIS — R05 Cough: Secondary | ICD-10-CM | POA: Diagnosis not present

## 2017-07-27 DIAGNOSIS — M791 Myalgia, unspecified site: Secondary | ICD-10-CM | POA: Diagnosis not present

## 2017-07-27 DIAGNOSIS — J111 Influenza due to unidentified influenza virus with other respiratory manifestations: Secondary | ICD-10-CM

## 2017-07-27 DIAGNOSIS — R69 Illness, unspecified: Secondary | ICD-10-CM

## 2017-07-27 MED ORDER — BENZONATATE 100 MG PO CAPS: 100.0000 mg | ORAL_CAPSULE | Freq: Three times a day (TID) | ORAL | 0 refills | Status: DC | PRN

## 2017-07-27 MED ORDER — OSELTAMIVIR PHOSPHATE 75 MG PO CAPS: 75.0000 mg | ORAL_CAPSULE | Freq: Two times a day (BID) | ORAL | 0 refills | Status: DC

## 2017-07-27 NOTE — ED Provider Notes (Signed)
[facility] ____________________________________________  Time seen: Approximately 1030 AM  I have reviewed the triage vital signs and the nursing notes.   HISTORY  Chief Complaint Generalized Body Aches  HPI Joann Jones is a 32 y.o. female presenting for evaluation of runny nose, nasal congestion, cough, chills and body aches since yesterday.  States symptoms were quick in onset.  Reports last night did have fever up to 100.7.  Did take some over-the-counter medications yesterday, none today.  Reports last week she had a mild sore throat that did fully resolve, and is not currently present.  States with suspected viral versus allergies last week.  Reports this was separate sickness.  Reports her daughter has some nasal congestion but denies other home sick contacts.  Patient does work with direct patient care and reports in the last few days she had a influenza positive patient that was coughing in her face, and expresses this is a concern of the direct trigger.  Denies other aggravating or alleviating factors.  Reports feels tired and with body aches. Denies chest pain, shortness of breath, abdominal pain, or rash. Denies recent sickness. Denies recent antibiotic use.   Leotis Shames, MD: PCP No LMP recorded. (Menstrual status: Oral contraceptives).   Past Medical History:  Diagnosis Date  . Anemia   . Anxiety   . Asthma    with exertion  . Collagen vascular disease (HCC)   . Complication of anesthesia    hard to wake up  . Dyspnea   . Dysrhythmia    tachycardia  . GERD (gastroesophageal reflux disease)   . IBS (irritable bowel syndrome)   . ITP (idiopathic thrombocytopenic purpura)   . Lupus (HCC)   . Migraines   . Palpitations   . Pelvic kidney     There are no active problems to display for this patient.   Past Surgical History:  Procedure Laterality Date  . COLONOSCOPY WITH PROPOFOL N/A 07/18/2016   Procedure: COLONOSCOPY WITH PROPOFOL;  Surgeon:  Scot Jun, MD;  Location: [facility] ENDOSCOPY;  Service: Endoscopy;  Laterality: N/A;  . ESOPHAGOGASTRODUODENOSCOPY (EGD) WITH PROPOFOL N/A 07/18/2016   Procedure: ESOPHAGOGASTRODUODENOSCOPY (EGD) WITH PROPOFOL;  Surgeon: Scot Jun, MD;  Location: [facility] ENDOSCOPY;  Service: Endoscopy;  Laterality: N/A;  . HERNIA REPAIR    . WRIST SURGERY Right      No current facility-administered medications for this encounter.   Current Outpatient Medications:  .  escitalopram (LEXAPRO) 20 MG tablet, Take 20 mg by mouth daily., Disp: , Rfl:  .  hydroxychloroquine (PLAQUENIL) 200 MG tablet, Take by mouth daily., Disp: , Rfl:  .  ALPRAZolam (XANAX) 0.25 MG tablet, Take 0.25 mg by mouth at bedtime as needed for anxiety., Disp: , Rfl:  .  benzonatate (TESSALON PERLES) 100 MG capsule, Take 1 capsule (100 mg total) by mouth 3 (three) times daily as needed for cough., Disp: 15 capsule, Rfl: 0 .  cyclobenzaprine (FLEXERIL) 10 MG tablet, Take 10 mg by mouth 3 (three) times daily as needed for muscle spasms., Disp: , Rfl:  .  oseltamivir (TAMIFLU) 75 MG capsule, Take 1 capsule (75 mg total) by mouth every 12 (twelve) hours., Disp: 10 capsule, Rfl: 0  Allergies Codeine and Septra [sulfamethoxazole-trimethoprim]  Family History  Problem Relation Age of Onset  . Hypertension Mother   . Hyperlipidemia Mother   . Hypertension Father   . Hyperlipidemia Father     Social History Social History   Tobacco Use  . Smoking status: Former  Smoker    Last attempt to quit: 10/21/2005    Years since quitting: 11.7  . Smokeless tobacco: Never Used  Substance Use Topics  . Alcohol use: Yes    Comment: socially  . Drug use: No    Review of Systems Constitutional: As above.  ENT: No sore throat. As above.  Cardiovascular: Denies chest pain. Respiratory: Denies shortness of breath. Gastrointestinal: No abdominal pain.  No nausea, no vomiting.  Reports some diarrhea today.  Genitourinary: Negative for  dysuria. Musculoskeletal: Negative for back pain. Skin: Negative for rash.   ____________________________________________   PHYSICAL EXAM:  VITAL SIGNS: ED Triage Vitals [07/27/17 0949]  Enc Vitals Group     BP 109/79     Pulse Rate (!) 119     Resp 16     Temp 98.3 F (36.8 C)     Temp Source Oral     SpO2 99 %     Weight 145 lb (65.8 kg)     Height 5\' 4"  (1.626 m)     Head Circumference      Peak Flow      Pain Score 8     Pain Loc      Pain Edu?      Excl. in GC?     Constitutional: Alert and oriented. Well appearing and in no acute distress. Eyes: Conjunctivae are normal.  Head: Atraumatic. No sinus tenderness to palpation. No swelling. No erythema.  Ears: no erythema, normal TMs bilaterally.   Nose:Nasal congestion with clear rhinorrhea  Mouth/Throat: Mucous membranes are moist. No pharyngeal erythema. No tonsillar swelling or exudate.  Neck: No stridor.  No cervical spine tenderness to palpation. Hematological/Lymphatic/Immunilogical: No cervical lymphadenopathy. Cardiovascular: Normal rate, regular rhythm. Grossly normal heart sounds.  Good peripheral circulation. Respiratory: Normal respiratory effort.  No retractions. No wheezes, rales or rhonchi. Good air movement.  Gastrointestinal: Soft and nontender. Musculoskeletal: Ambulatory with steady gait. No cervical, thoracic or lumbar tenderness to palpation. Neurologic:  Normal speech and language. No gait instability. Skin:  Skin appears warm, dry and intact. No rash noted. Psychiatric: Mood and affect are normal. Speech and behavior are normal.  ___________________________________________   LABS (all labs ordered are listed, but only abnormal results are displayed)  Labs Reviewed - No data to display ____________________________________________   PROCEDURES Procedures    INITIAL IMPRESSION / ASSESSMENT AND PLAN / ED COURSE  Pertinent labs & imaging results that were available during my care of the  patient were reviewed by me and considered in my medical decision making (see chart for details).  Very well-appearing patient.  No acute distress.  Suspect viral illness such as influenza.  Discussed treatment with Tamiflu, Rx given.  PRN Tessalon Perles.  Encourage rest, fluids, supportive care. Discussed indication, risks and benefits of medications with patient.  Discussed follow up with Primary care physician this week. Discussed follow up and return parameters including no resolution or any worsening concerns. Patient verbalized understanding and agreed to plan.   ____________________________________________   FINAL CLINICAL IMPRESSION(S) / ED DIAGNOSES  Final diagnoses:  Influenza-like illness     ED Discharge Orders        Ordered    oseltamivir (TAMIFLU) 75 MG capsule  Every 12 hours     07/27/17 1016    benzonatate (TESSALON PERLES) 100 MG capsule  3 times daily PRN     07/27/17 1016       Note: This dictation was prepared with Dragon dictation along with smaller phrase  technology. Any transcriptional errors that result from this process are unintentional.         Renford DillsMiller, Kairo Laubacher, NP 07/27/17 1121

## 2017-07-27 NOTE — ED Triage Notes (Signed)
Patient in today c/o body aches, chills, cough x 1 day.

## 2017-07-27 NOTE — Discharge Instructions (Addendum)
Take medication as prescribed. Rest. Drink plenty of fluids.  ° °Follow up with your primary care physician this week as needed. Return to Urgent care for new or worsening concerns.  ° °

## 2017-12-19 ENCOUNTER — Other Ambulatory Visit: Payer: Self-pay

## 2017-12-19 ENCOUNTER — Ambulatory Visit
Admission: EM | Admit: 2017-12-19 | Discharge: 2017-12-19 | Disposition: A | Discharge status: Home / Self Care | Payer: BLUE CROSS/BLUE SHIELD

## 2017-12-19 DIAGNOSIS — K648 Other hemorrhoids: Secondary | ICD-10-CM

## 2017-12-19 DIAGNOSIS — K625 Hemorrhage of anus and rectum: Secondary | ICD-10-CM | POA: Diagnosis not present

## 2017-12-19 DIAGNOSIS — K644 Residual hemorrhoidal skin tags: Secondary | ICD-10-CM

## 2017-12-19 MED ORDER — HYDROCORTISONE 2.5 % RE CREA: 1.0000 "application " | TOPICAL_CREAM | Freq: Two times a day (BID) | RECTAL | 1 refills | Status: DC

## 2017-12-19 NOTE — ED Triage Notes (Signed)
Pt reports she noticed last night a bump around her anus and pain 5/10.

## 2017-12-19 NOTE — ED Provider Notes (Signed)
[facility]  CSN: [id number] Arrival date & time: 12/19/17  1129  History   Chief Complaint Chief Complaint  Patient presents with  . Rectal Pain   HPI 32 year old female presents with rectal pain.  Started last night.  She noticed a bump on rectum.  Pain is moderate currently.  No bleeding.  No medications or interventions tried.  She has had some hard stool recently.  No other associated symptoms.  No other complaints.  PMH, Surgical Hx, Family Hx, Social History reviewed and updated as below.  Past Medical History:  Diagnosis Date  . Anemia   . Anxiety   . Asthma    with exertion  . Collagen vascular disease (HCC)   . Complication of anesthesia    hard to wake up  . Dyspnea   . Dysrhythmia    tachycardia  . GERD (gastroesophageal reflux disease)   . IBS (irritable bowel syndrome)   . ITP (idiopathic thrombocytopenic purpura)   . Lupus (HCC)   . Migraines   . Palpitations   . Pelvic kidney     Past Surgical History:  Procedure Laterality Date  . COLONOSCOPY WITH PROPOFOL N/A 07/18/2016   Procedure: COLONOSCOPY WITH PROPOFOL;  Surgeon: Scot Junobert T Elliott, MD;  [facility] END[facility]COPY;  Service: Endoscopy;  Laterality: N/A;  . ESOPHAGOGASTRODUODENOSCOPY (EGD) WITH PROPOFOL N/A 07/18/2016   Procedure: ESOPHAGOGASTRODUODENOSCOPY (EGD) WITH PROPOFOL;  Surgeon: Scot Junobert T Elliott, MD;  [facility];  Service: Endoscopy;  Laterality: N/A;  . HERNIA REPAIR    . WRIST SURGERY Right    OB History   None    Home Medications    Prior to Admission medications   Medication Sig Start Date End Date Taking? Authorizing Provider  levonorgestrel-ethinyl estradiol (SEASONALE,INTROVALE,JOLESSA) 0.15-0.03 MG tablet Take 1 tablet by mouth daily.   Yes [provider]  omeprazole (PRILOSEC) 40 MG capsule Take by mouth. 08/03/17 01/30/18 Yes [provider]  zolpidem (AMBIEN) 5 MG tablet Take by mouth. 12/04/17  Yes [provider]    ALPRAZolam Prudy Feeler(XANAX) 0.25 MG tablet Take 0.25 mg by mouth at bedtime as needed for anxiety.    [provider]  escitalopram (LEXAPRO) 20 MG tablet Take 20 mg by mouth daily.    [provider]  hydrocortisone (ANUSOL-HC) 2.5 % rectal cream Place 1 application rectally 2 (two) times daily. 12/19/17   Tommie Samsook, Dreux Mcgroarty G, DO  hydroxychloroquine (PLAQUENIL) 200 MG tablet Take by mouth daily.    [provider]  zolmitriptan (ZOMIG-ZMT) 2.5 MG disintegrating tablet Take by mouth.    [provider]   Family History Family History  Problem Relation Age of Onset  . Hypertension Mother   . Hyperlipidemia Mother   . Hypertension Father   . Hyperlipidemia Father    Social History Social History   Tobacco Use  . Smoking status: Former Smoker    Last attempt to quit: 10/21/2005    Years since quitting: 12.1  . Smokeless tobacco: Never Used  Substance Use Topics  . Alcohol use: Yes    Comment: socially  . Drug use: No   Allergies   Codeine and Septra [sulfamethoxazole-trimethoprim]   Review of Systems Review of Systems  Constitutional: Negative.   Gastrointestinal: Positive for rectal pain.   Physical Exam Triage Vital Signs ED Triage Vitals  Enc Vitals Group     BP 12/19/17 1134 132/84     Pulse Rate 12/19/17 1134 87     Resp 12/19/17 1134 16  Temp 12/19/17 1134 98.4 F (36.9 C)     Temp Source 12/19/17 1134 Oral     SpO2 12/19/17 1134 100 %     Weight --      Height --      Head Circumference --      Peak Flow --      Pain Score 12/19/17 1135 5     Pain Loc --      Pain Edu? --      Excl. in GC? --    Updated Vital Signs BP 132/84 (BP Location: Left Arm)   Pulse 87   Temp 98.4 F (36.9 C) (Oral)   Resp 16   SpO2 100%   Visual Acuity Right Eye Distance:   Left Eye Distance:   Bilateral Distance:    Right Eye Near:   Left Eye Near:    Bilateral Near:     Physical Exam  Constitutional: She is oriented to person, place, and  time. She appears well-developed. No distress.  HENT:  Head: Normocephalic and atraumatic.  Pulmonary/Chest: Effort normal. No respiratory distress.  Genitourinary: Rectal exam shows external hemorrhoid. Rectal exam shows no tenderness.  Neurological: She is alert and oriented to person, place, and time.  Psychiatric: She has a normal mood and affect. Her behavior is normal.  Nursing note and vitals reviewed.  UC Treatments / Results  Labs (all labs ordered are listed, but only abnormal results are displayed) Labs Reviewed - No data to display  EKG None  Radiology No results found.  Procedures Procedures (including critical care time)  Medications Ordered in UC Medications - No data to display  Initial Impression / Assessment and Plan / UC Course  I have reviewed the triage vital signs and the nursing notes.  Pertinent labs & imaging results that were available during my care of the patient were reviewed by me and considered in my medical decision making (see chart for details).    32 year old female presents with an external hemorrhoid. Anusol as directed. Supportive care.  Final Clinical Impressions(s) / UC Diagnoses   Final diagnoses:  External hemorrhoid     Discharge Instructions     Medication twice daily as needed.  Dr. Adriana Simas    ED Prescriptions    Medication Sig Dispense Auth. Provider   hydrocortisone (ANUSOL-HC) 2.5 % rectal cream Place 1 application rectally 2 (two) times daily. 30 g Tommie Sams, DO     Controlled Substance Prescriptions [place] Controlled Substance Registry consulted? Not Applicable   Tommie Sams, DO 12/19/17 1158

## 2017-12-19 NOTE — Discharge Instructions (Signed)
Medication twice daily as needed.  Dr. Adriana Simasook

## 2018-05-05 ENCOUNTER — Other Ambulatory Visit: Payer: Self-pay | Admitting: Student

## 2018-05-05 DIAGNOSIS — R1011 Right upper quadrant pain: Secondary | ICD-10-CM

## 2018-05-05 DIAGNOSIS — R1010 Upper abdominal pain, unspecified: Secondary | ICD-10-CM

## 2018-05-12 ENCOUNTER — Other Ambulatory Visit: Payer: BLUE CROSS/BLUE SHIELD

## 2018-05-14 ENCOUNTER — Ambulatory Visit: Admission: RE | Admit: 2018-05-14 | Payer: BLUE CROSS/BLUE SHIELD | Source: Ambulatory Visit

## 2018-05-14 ENCOUNTER — Ambulatory Visit: Payer: BLUE CROSS/BLUE SHIELD

## 2018-10-18 ENCOUNTER — Other Ambulatory Visit: Payer: Self-pay

## 2018-10-18 ENCOUNTER — Ambulatory Visit (HOSPITAL_COMMUNITY)
Admission: EM | Admit: 2018-10-18 | Discharge: 2018-10-18 | Disposition: A | Discharge status: Home / Self Care | Payer: BC Managed Care – PPO

## 2018-10-18 ENCOUNTER — Encounter (HOSPITAL_COMMUNITY): Payer: Self-pay

## 2018-10-18 DIAGNOSIS — G43809 Other migraine, not intractable, without status migrainosus: Secondary | ICD-10-CM

## 2018-10-18 DIAGNOSIS — Z8669 Personal history of other diseases of the nervous system and sense organs: Secondary | ICD-10-CM

## 2018-10-18 DIAGNOSIS — R110 Nausea: Secondary | ICD-10-CM

## 2018-10-18 MED ORDER — ONDANSETRON 8 MG PO TBDP: 8.0000 mg | ORAL_TABLET | Freq: Three times a day (TID) | ORAL | 0 refills | Status: DC | PRN

## 2018-10-18 MED ORDER — KETOROLAC TROMETHAMINE 60 MG/2ML IM SOLN
INTRAMUSCULAR | Status: AC
  Filled 2018-10-18: qty 2

## 2018-10-18 MED ORDER — KETOROLAC TROMETHAMINE 60 MG/2ML IM SOLN
60.0000 mg | Freq: Once | INTRAMUSCULAR | Status: AC
  Administered 2018-10-18: 60 mg via INTRAMUSCULAR

## 2018-10-18 NOTE — Discharge Instructions (Addendum)
Take 2.5mg  of Zomig at the start of a migraine and repeat the dose in 2 hours if your migraine persists. Do not exceed 10mg  in one day. You can use Flexeril as a muscle relaxant as well.

## 2018-10-18 NOTE — ED Provider Notes (Signed)
MRN: [id number] DOB: 1985/06/13  Subjective:   Joann Jones is a 33 y.o. female presenting for 3-day history of recurrent constant moderate to severe persistent migraine headache.  Currently headache is over her occiput and is radiating into her neck causing significant tension.  She has had nausea with this as well.  The migraine is not responding to her regular medicine Zomig and she has also tried Tylenol.   Current Facility-Administered Medications:  .  ketorolac (TORADOL) injection 60 mg, 60 mg, Intramuscular, Once, Jaynee Eagles, PA-C  Current Outpatient Medications:  .  omeprazole (PRILOSEC) 40 MG capsule, Take by mouth., Disp: , Rfl:  .  sertraline (ZOLOFT) 100 MG tablet, Take by mouth., Disp: , Rfl:  .  ALPRAZolam (XANAX) 0.25 MG tablet, Take 0.25 mg by mouth at bedtime as needed for anxiety., Disp: , Rfl:  .  escitalopram (LEXAPRO) 20 MG tablet, Take 20 mg by mouth daily., Disp: , Rfl:  .  hydrocortisone (ANUSOL-HC) 2.5 % rectal cream, Place 1 application rectally 2 (two) times daily., Disp: 30 g, Rfl: 1 .  hydroxychloroquine (PLAQUENIL) 200 MG tablet, Take by mouth daily., Disp: , Rfl:  .  levonorgestrel-ethinyl estradiol (SEASONALE,INTROVALE,JOLESSA) 0.15-0.03 MG tablet, Take 1 tablet by mouth daily., Disp: , Rfl:  .  ondansetron (ZOFRAN-ODT) 8 MG disintegrating tablet, Take 1 tablet (8 mg total) by mouth every 8 (eight) hours as needed for nausea or vomiting., Disp: 20 tablet, Rfl: 0 .  zolmitriptan (ZOMIG-ZMT) 2.5 MG disintegrating tablet, Take by mouth., Disp: , Rfl:  .  zolpidem (AMBIEN) 5 MG tablet, Take by mouth., Disp: , Rfl:    Allergies  Allergen Reactions  . Codeine   . Septra [Sulfamethoxazole-Trimethoprim] Other (See Comments)    Decrease platlets    Past Medical History:  Diagnosis Date  . Anemia   . Anxiety   . Asthma    with exertion  . Collagen vascular disease ([place])   . Complication of anesthesia    hard to wake up  . Dyspnea   . Dysrhythmia    tachycardia  . GERD (gastroesophageal reflux disease)   . IBS (irritable bowel syndrome)   . ITP (idiopathic thrombocytopenic purpura)   . Lupus ([place])   . Migraines   . Palpitations   . Pelvic kidney      Past Surgical History:  Procedure Laterality Date  . COLONOSCOPY WITH PROPOFOL N/A 07/18/2016   Procedure: COLONOSCOPY WITH PROPOFOL;  Surgeon: Manya Silvas, MD;  Location: [facility] ENDOSCOPY;  Service: Endoscopy;  Laterality: N/A;  . ESOPHAGOGASTRODUODENOSCOPY (EGD) WITH PROPOFOL N/A 07/18/2016   Procedure: ESOPHAGOGASTRODUODENOSCOPY (EGD) WITH PROPOFOL;  Surgeon: Manya Silvas, MD;  Location: [facility] ENDOSCOPY;  Service: Endoscopy;  Laterality: N/A;  . HERNIA REPAIR    . WRIST SURGERY Right     ROS  Objective:   Vitals: BP 136/83 (BP Location: Right Arm)   Pulse 75   Temp 98.3 F (36.8 C) (Oral)   Resp 16   SpO2 100%   Physical Exam Constitutional:      General: She is not in acute distress.    Appearance: Normal appearance. She is well-developed. She is not ill-appearing.  HENT:     Head: Normocephalic and atraumatic.     Nose: Nose normal.     Mouth/Throat:     Mouth: Mucous membranes are moist.     Pharynx: Oropharynx is clear.  Eyes:     General: No scleral icterus.    Extraocular Movements: Extraocular movements intact.  Pupils: Pupils are equal, round, and reactive to light.  Cardiovascular:     Rate and Rhythm: Normal rate.  Pulmonary:     Effort: Pulmonary effort is normal.  Skin:    General: Skin is warm and dry.  Neurological:     General: No focal deficit present.     Mental Status: She is alert and oriented to person, place, and time.  Psychiatric:        Mood and Affect: Mood normal.        Behavior: Behavior normal.     Assessment and Plan :   1. Other migraine without status migrainosus, not intractable   2. Nausea without vomiting   3. History of migraine    We will use IM Toradol in clinic.  Counseled patient on dosing of Zomig  and will also have patient use Zofran and Flexeril for supportive care. Counseled patient on potential for adverse effects with medications prescribed/recommended today, ER and return-to-clinic precautions discussed, patient verbalized understanding.    Wallis BambergMani, Gaia Gullikson, PA-C 10/18/18 1350

## 2018-10-18 NOTE — ED Triage Notes (Signed)
Patient presents to Urgent Care with complaints of migraine for three days, primarily tightness now in the posterior head. Patient reports she has tried tylenol, ibuprofen, without relief. Pt endorses nausea but no dizziness or emesis.

## 2019-04-08 NOTE — L&D Delivery Note (Signed)
Delivery Note Pt pushed very well x 3 for delivery.  At 6:34 PM a viable and healthy female was delivered via Vaginal, Spontaneous (Presentation: Middle Occiput Posterior).  APGAR:4/8 ; weight P (unofficial 4#5).   Placenta status: Manually extracted Pathology, Intact.  Cord: 3 vessels with the following complications: x 3.  Cord pH: P  Anesthesia: Epidural Episiotomy: None Lacerations:  none Suture Repair: N/A Est. Blood Loss (mL):  247cc  Mom to postpartum.  Baby to Couplet care / Skin to Skin.  Joann Jones 01/12/2020, 7:04 PM  Br/RI/Tdap/A+/Contra ?

## 2019-07-07 ENCOUNTER — Other Ambulatory Visit (HOSPITAL_COMMUNITY): Payer: Self-pay | Admitting: Obstetrics and Gynecology

## 2019-07-19 ENCOUNTER — Encounter (HOSPITAL_COMMUNITY): Payer: Self-pay | Admitting: *Deleted

## 2019-07-20 ENCOUNTER — Ambulatory Visit (HOSPITAL_COMMUNITY): Payer: BLUE CROSS/BLUE SHIELD | Attending: Obstetrics and Gynecology | Admitting: Obstetrics and Gynecology

## 2019-07-20 ENCOUNTER — Other Ambulatory Visit: Payer: Self-pay

## 2019-07-20 ENCOUNTER — Encounter (HOSPITAL_COMMUNITY): Payer: Self-pay

## 2019-07-20 ENCOUNTER — Ambulatory Visit (HOSPITAL_COMMUNITY): Payer: BLUE CROSS/BLUE SHIELD | Admitting: *Deleted

## 2019-07-20 VITALS — BP 124/84 | HR 91 | Temp 98.7°F

## 2019-07-20 DIAGNOSIS — O10911 Unspecified pre-existing hypertension complicating pregnancy, first trimester: Secondary | ICD-10-CM | POA: Insufficient documentation

## 2019-07-20 DIAGNOSIS — Z8669 Personal history of other diseases of the nervous system and sense organs: Secondary | ICD-10-CM

## 2019-07-20 DIAGNOSIS — G4733 Obstructive sleep apnea (adult) (pediatric): Secondary | ICD-10-CM | POA: Insufficient documentation

## 2019-07-20 DIAGNOSIS — O99119 Other diseases of the blood and blood-forming organs and certain disorders involving the immune mechanism complicating pregnancy, unspecified trimester: Secondary | ICD-10-CM

## 2019-07-20 DIAGNOSIS — Z885 Allergy status to narcotic agent status: Secondary | ICD-10-CM | POA: Insufficient documentation

## 2019-07-20 DIAGNOSIS — Z8759 Personal history of other complications of pregnancy, childbirth and the puerperium: Secondary | ICD-10-CM

## 2019-07-20 DIAGNOSIS — O10919 Unspecified pre-existing hypertension complicating pregnancy, unspecified trimester: Secondary | ICD-10-CM

## 2019-07-20 DIAGNOSIS — Z8249 Family history of ischemic heart disease and other diseases of the circulatory system: Secondary | ICD-10-CM | POA: Diagnosis not present

## 2019-07-20 DIAGNOSIS — O99891 Other specified diseases and conditions complicating pregnancy: Secondary | ICD-10-CM | POA: Insufficient documentation

## 2019-07-20 DIAGNOSIS — O99511 Diseases of the respiratory system complicating pregnancy, first trimester: Secondary | ICD-10-CM | POA: Insufficient documentation

## 2019-07-20 DIAGNOSIS — G43909 Migraine, unspecified, not intractable, without status migrainosus: Secondary | ICD-10-CM | POA: Diagnosis not present

## 2019-07-20 DIAGNOSIS — Z3A11 11 weeks gestation of pregnancy: Secondary | ICD-10-CM | POA: Diagnosis not present

## 2019-07-20 DIAGNOSIS — M329 Systemic lupus erythematosus, unspecified: Secondary | ICD-10-CM | POA: Diagnosis not present

## 2019-07-20 DIAGNOSIS — O26891 Other specified pregnancy related conditions, first trimester: Secondary | ICD-10-CM | POA: Insufficient documentation

## 2019-07-20 DIAGNOSIS — D6862 Lupus anticoagulant syndrome: Secondary | ICD-10-CM

## 2019-07-20 NOTE — Progress Notes (Signed)
Maternal-Fetal Medicine   Name: Joann Jones MRN: [id number] Referring Provider: Eula Flax, MD  I had the pleasure of seeing Joann Jones today at the [place] for maternal fetal care.  She is a gravida 2 para 1 at the [redacted] weeks gestation and is here for consultation.  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  Past medical history significant for SLE was diagnosed in 2015.  She was complaining of fatigue and joint pains.  Her blood pressures were within normal limits.  Patient also reported she had thrombocytopenia.  She is being followed by her rheumatologist Joann Jones at [facility].  She does not have lupus nephritis. Does not have antiphospholipid antibodies or increased [place] antibodies.  She takes hydroxychloroquine 400 mg once daily and has not had flares (ill-defined with occasional joint pains) for several months. Chronic hypertension was also diagnosed in 2015 and the patient now takes amlodipine 2.5 mg once daily.  Her blood pressures are within normal limits.  Blood pressure today at our office is 120/84 mmHg. She also has pain intermittent asthma that is stable with medications. Patient reports supraventricular tachycardia (SVT) was diagnosed in 2015 and she was not taking any antiarrhythmic drugs and gives no history of ablative treatment.  She has occasional tachycardia detected at office but is not symptomatic. Obstructive sleep apnea: She had sleep study in 2017 at [facility] and the patient was using CPAP machine that was discontinued about 2 years ago.  She has an appointment with [place] sleep specialist and would like to resume  CPAP. Irritable bowel syndrome: Patient reports occasional constipation but no diarrhea. Migraine: Diagnosed in 2012 she was taking triptan's.  She has not had headache for about 2 months and he is not taking any medications.  Migraine is not preceded by aura.  She usually has bilateral headaches and nausea. During her last pregnancy, she had pericardial effusion that resolved.  Recent echocardiography showed normal ejection fraction (64% (.  Cardiac MRI and EKG were normal.  Review of systems: Remittent headaches, no visual disturbances, occasional chest pain (rule out cardiac problems), no palpitations.  Patient has nausea but no vomiting.  No abdominal pain or recurrent urinary infection.  She has been having vaginal bleeding off and on and this pregnancy.  Joint pains.  Past surgical history: Hernia repair, de Quervain's tendon release surgery (2016). Medications: Hydroxychloroquine 400 mg once daily, amlodipine 2.5 mg once daily, Neurontin 130 mg daily, prenatal vitamins, omeprazole 40 mg daily, Diclegis.  Patient will be starting low-dose aspirin from 12 weeks. Allergies: Bactrim (?  Thrombocytopenia), codeine (stomach pain). Social history: Denies tobacco drug or alcohol use.  She is married and her husband is African-American.  He has diabetes and sarcoidosis.  Both patient and her husband do not have sickle cell trait.  She is a Merchandiser, retail at [employer]. Family history: Both parents have hypertension.  She has a twin sister and does not have any medical conditions except mild asthma.  Obstetric history is significant for a term vaginal delivery female infant weighing 4-12 birth.  Pregnancy was complicated by fetal growth restriction and she had induction of labor at [redacted] weeks gestation.  GYN history: She had abnormal Pap smears at age 60 that resolved.  No history of  cervical surgeries.  No history of breast disease.  She had irregular periods and discontinued oral  contraceptives before pregnancy.  Prenatal course:  She has been having intermittent vaginal bleeding.  She had an early ultrasound at that established gestational age.  Subchorionic hemorrhage was seen on ultrasound.  Good fetal heart activity was seen.  Labs Anticardiolipin antibodies IgM/IgG/IgA-negative, dRVVT normal, anti-SSA and SSB antibodies were not increased, platelets 179K, creatinine 0.8, ALT 51, AST 38, complements C3 and C4, TSH 1.38. January 2021: MRI brain and MRA no lesions (indication: headache).  Maternal echo showed an ejection fraction of 65%.  Cardiac MRI normal. EKG normal.  The patient on the following Systemic lupus erythematosus in pregnancy: -Although she reported she has SLE, her rheumatologist note mentions undifferentiated connective tissue disease (UCTD).  Broadly, the counseling is similar in both cases. -I reassured the patient that since she conceived in the quiescent phase (no place), she is likely to experience adverse outcomes.  She did not have symptomatic flares in the past except mild joint pains. - Although some studies have reported increase in flares during pregnancy (13% to 65%), it is variable. -I discussed the safety profile of prednisone and immunosuppressants (azathioprine) that may be given if she has severe flares.  Only a small amount of prednisone (10%) crosses the placenta and is not associated with fetal adverse outcomes.  If prednisone treatment is essential minimal dosage (7.5 mg or less) is ideal for maintenance. -Active SLE can cause adverse outcomes in pregnancy.  Conditions including miscarriage, preterm delivery, stillbirth, fetal growth restriction, preeclampsia, and placental abruption. -Absence of increased anti-SSA and SSB antibodies are reassuring.  The likelihood of congenital heart block or neonatal lupus is extremely unlikely. -I also reassured her that she does not have antiphospholipid antibodies. -Her platelet counts were 179K,  which is inconsistent with immune thrombocytopenia. -Discussed ultrasound protocol the management of SLE.  Serial fetal growth assessments and weekly antenatal testing from [redacted] weeks gestation (because of hypertension) are recommended. -Vaginal delivery is not contraindicated and induction of labor may be considered at [redacted] weeks gestation provided her blood pressures are within normal range.  Chronic hypertension Adverse pregnancy outcomes are correlated with the severity of hypertension.  Encouraged her to continue amlodipine which is safe in pregnancy. -Preeclampsia occurs in about 30% of women with chronic hypertension. -I discussed the benefit of low-dose aspirin prophylaxis.  Patient does not have contraindications to aspirin.  I encouraged her to take 2 tablets of aspirin 81 mg (total dose 162 mg).  Migraine: -Fortunately, migraine improves in pregnancy in most cases.  If headaches become frequent and need treatment, Tylenol may be given first in intractable migraines triptan's may be given.  She does not have any trigger factors.  People who experience migraine without aura are likely to have fewer episodes in pregnancy.  Obstructive sleep apnea (OSA):  The incidence of OSA in pregnancy is quoted between 1% and 6%. In addition, pregnancy increases nasal congestion that aggravates OSA. Progesterone increase in pregnancy increases minute ventilation and can potentially worsen OSA. Patient will be consulting her sleep specialist again and it is encouraging to note that she is willing to resume CPAP machine. Long-term consequences of OSA include hypertension (already existent), pulmonary hypertension, cerebrovascular disease, diabetes, and anesthetic complications. In pregnancy, preeclampsia and gestational diabetes are more frequent in women with OSA. However, fetal growth restriction or stillbirth is not increased.  During labor, administration of narcotics and sedatives can cause apnea. Early  placement of epidural  analgesia should be considered so that emergency cesarean section need not be performed under general anesthesia. Proper positioning of the patient during labor is also to be considered. I strongly recommended using CPAP machine and a follow-up consultation with the specialist.   Medications: Hydroxychloroquine: This drug can be safely given in pregnancy.  It is not associated with increased risk of fetal congenital malformations.  Some studies have shown reduce the likelihood of preeclampsia with the use of this medication. Sertraline: No increase in congenital malformations is expected with the use of this medication. It is an SSRI agent. A small absolute increase in primary pulmonary hypertension has been observed. However, untreated depression can be associated with adverse maternal outcomes that can override the above-mentioned risk. Patient was counseled on the small risk for primary pulmonary hypertension.  Other conditions including SVT and asthma unlikely to pose any problems.  Prenatal -I discussed the significance of cell free fetal DNA screening and informed her that only invasive testing including chorionic villous sampling or amniocentesis will give a definitive result on the fetal karyotype.  Patient opted not to have invasive testing. -Vaginal bleeding increases the risk of miscarriage and if continued in the second trimester, it increases the risk of preterm delivery.  If bleeding resolves, pregnancy outcome is expected. -Patient had questions about COVID vaccine.  Available information has not shown increased adverse outcomes in pregnancy.  Fetal effects however are not clearly known. CDC/ACOG/SMFM (professional bodies) recommend that it be made available to pregnant women. Overall, I informed the patient that COVID infection (especially in the third trimester) is associated with increased maternal complications including ICU admissions. Most cases (85%), however,  have mild symptoms or asymptomatic.  Recommendations -Fetal anatomy scan at 18 to 20 weeks. -Fetal growth assessment (from 24 weeks) every 4 weeks till delivery. -Weekly antenatal testing (BPP) from 32 weeks till delivery. -Continue amlodipine.  -Aspirin 162 mg (2 tabs) daily from 12 weeks till delivery. -Continue hydroxychloroquine 400 mg daily or as directed by her rhematologist. -Continue follow-up with her rheumatologist. -Keep appointment with Sleep specialist. -Encouraged to use CPAP. -Repeat LFTs and platelets in 1 to 2 weeks. If ALT is elevated, hepatitis panel may be considered.  -Continue sertraline.  Thank you for consultation. Please do not hesitate to contact me at the [facility] if you have any questions. Consultation including face-to-face counseling: 60 minutes.

## 2019-07-21 ENCOUNTER — Encounter (HOSPITAL_COMMUNITY): Payer: Self-pay | Admitting: Obstetrics and Gynecology

## 2019-09-15 ENCOUNTER — Other Ambulatory Visit: Payer: Self-pay | Admitting: *Deleted

## 2019-09-15 DIAGNOSIS — Z363 Encounter for antenatal screening for malformations: Secondary | ICD-10-CM

## 2019-09-15 DIAGNOSIS — O283 Abnormal ultrasonic finding on antenatal screening of mother: Secondary | ICD-10-CM

## 2019-09-15 DIAGNOSIS — Z3A19 19 weeks gestation of pregnancy: Secondary | ICD-10-CM

## 2019-09-16 ENCOUNTER — Other Ambulatory Visit: Payer: Self-pay | Admitting: *Deleted

## 2019-09-16 ENCOUNTER — Other Ambulatory Visit: Payer: Self-pay

## 2019-09-16 ENCOUNTER — Ambulatory Visit: Payer: BC Managed Care – PPO | Admitting: *Deleted

## 2019-09-16 ENCOUNTER — Ambulatory Visit: Payer: BC Managed Care – PPO | Attending: Obstetrics and Gynecology

## 2019-09-16 VITALS — BP 112/70 | HR 101

## 2019-09-16 DIAGNOSIS — O10012 Pre-existing essential hypertension complicating pregnancy, second trimester: Secondary | ICD-10-CM

## 2019-09-16 DIAGNOSIS — Z3A19 19 weeks gestation of pregnancy: Secondary | ICD-10-CM

## 2019-09-16 DIAGNOSIS — O26892 Other specified pregnancy related conditions, second trimester: Secondary | ICD-10-CM

## 2019-09-16 DIAGNOSIS — Z363 Encounter for antenatal screening for malformations: Secondary | ICD-10-CM

## 2019-09-16 DIAGNOSIS — O26872 Cervical shortening, second trimester: Secondary | ICD-10-CM | POA: Diagnosis not present

## 2019-09-16 DIAGNOSIS — O289 Unspecified abnormal findings on antenatal screening of mother: Secondary | ICD-10-CM | POA: Diagnosis not present

## 2019-09-16 DIAGNOSIS — O09292 Supervision of pregnancy with other poor reproductive or obstetric history, second trimester: Secondary | ICD-10-CM

## 2019-09-16 DIAGNOSIS — O359XX0 Maternal care for (suspected) fetal abnormality and damage, unspecified, not applicable or unspecified: Secondary | ICD-10-CM | POA: Diagnosis present

## 2019-09-16 DIAGNOSIS — O283 Abnormal ultrasonic finding on antenatal screening of mother: Secondary | ICD-10-CM

## 2019-09-16 DIAGNOSIS — M329 Systemic lupus erythematosus, unspecified: Secondary | ICD-10-CM

## 2019-10-25 ENCOUNTER — Other Ambulatory Visit: Payer: Self-pay | Admitting: Maternal & Fetal Medicine

## 2019-10-25 ENCOUNTER — Other Ambulatory Visit: Payer: Self-pay | Admitting: *Deleted

## 2019-10-25 ENCOUNTER — Telehealth: Payer: Self-pay | Admitting: *Deleted

## 2019-10-25 ENCOUNTER — Other Ambulatory Visit: Payer: Self-pay

## 2019-10-25 ENCOUNTER — Ambulatory Visit: Payer: BC Managed Care – PPO | Admitting: *Deleted

## 2019-10-25 ENCOUNTER — Ambulatory Visit: Payer: BC Managed Care – PPO | Attending: Maternal & Fetal Medicine

## 2019-10-25 VITALS — BP 108/73 | HR 110

## 2019-10-25 DIAGNOSIS — O09292 Supervision of pregnancy with other poor reproductive or obstetric history, second trimester: Secondary | ICD-10-CM | POA: Diagnosis not present

## 2019-10-25 DIAGNOSIS — O365920 Maternal care for other known or suspected poor fetal growth, second trimester, not applicable or unspecified: Secondary | ICD-10-CM | POA: Diagnosis not present

## 2019-10-25 DIAGNOSIS — O289 Unspecified abnormal findings on antenatal screening of mother: Secondary | ICD-10-CM | POA: Diagnosis not present

## 2019-10-25 DIAGNOSIS — O10912 Unspecified pre-existing hypertension complicating pregnancy, second trimester: Secondary | ICD-10-CM | POA: Insufficient documentation

## 2019-10-25 DIAGNOSIS — M329 Systemic lupus erythematosus, unspecified: Secondary | ICD-10-CM

## 2019-10-25 DIAGNOSIS — O10919 Unspecified pre-existing hypertension complicating pregnancy, unspecified trimester: Secondary | ICD-10-CM

## 2019-10-25 DIAGNOSIS — O26892 Other specified pregnancy related conditions, second trimester: Secondary | ICD-10-CM

## 2019-10-25 DIAGNOSIS — O10012 Pre-existing essential hypertension complicating pregnancy, second trimester: Secondary | ICD-10-CM

## 2019-10-25 DIAGNOSIS — O26872 Cervical shortening, second trimester: Secondary | ICD-10-CM

## 2019-10-25 DIAGNOSIS — O359XX0 Maternal care for (suspected) fetal abnormality and damage, unspecified, not applicable or unspecified: Secondary | ICD-10-CM

## 2019-10-25 DIAGNOSIS — Z3A25 25 weeks gestation of pregnancy: Secondary | ICD-10-CM

## 2019-10-26 ENCOUNTER — Inpatient Hospital Stay (HOSPITAL_COMMUNITY): Payer: BC Managed Care – PPO

## 2019-10-26 ENCOUNTER — Encounter (HOSPITAL_COMMUNITY): Payer: Self-pay | Admitting: Obstetrics & Gynecology

## 2019-10-26 ENCOUNTER — Other Ambulatory Visit: Payer: Self-pay

## 2019-10-26 ENCOUNTER — Inpatient Hospital Stay (HOSPITAL_COMMUNITY)
Admission: AD | Admit: 2019-10-26 | Discharge: 2019-10-26 | Disposition: A | Discharge status: Home / Self Care | Payer: BC Managed Care – PPO | Attending: Obstetrics & Gynecology | Admitting: Obstetrics & Gynecology

## 2019-10-26 DIAGNOSIS — Z87891 Personal history of nicotine dependence: Secondary | ICD-10-CM | POA: Insufficient documentation

## 2019-10-26 DIAGNOSIS — O99012 Anemia complicating pregnancy, second trimester: Secondary | ICD-10-CM | POA: Diagnosis not present

## 2019-10-26 DIAGNOSIS — O99612 Diseases of the digestive system complicating pregnancy, second trimester: Secondary | ICD-10-CM | POA: Diagnosis not present

## 2019-10-26 DIAGNOSIS — Q676 Pectus excavatum: Secondary | ICD-10-CM | POA: Diagnosis not present

## 2019-10-26 DIAGNOSIS — M329 Systemic lupus erythematosus, unspecified: Secondary | ICD-10-CM | POA: Insufficient documentation

## 2019-10-26 DIAGNOSIS — O99112 Other diseases of the blood and blood-forming organs and certain disorders involving the immune mechanism complicating pregnancy, second trimester: Secondary | ICD-10-CM | POA: Insufficient documentation

## 2019-10-26 DIAGNOSIS — K219 Gastro-esophageal reflux disease without esophagitis: Secondary | ICD-10-CM | POA: Diagnosis not present

## 2019-10-26 DIAGNOSIS — R0602 Shortness of breath: Secondary | ICD-10-CM

## 2019-10-26 DIAGNOSIS — O99891 Other specified diseases and conditions complicating pregnancy: Secondary | ICD-10-CM | POA: Diagnosis not present

## 2019-10-26 DIAGNOSIS — O99512 Diseases of the respiratory system complicating pregnancy, second trimester: Secondary | ICD-10-CM | POA: Diagnosis present

## 2019-10-26 DIAGNOSIS — K589 Irritable bowel syndrome without diarrhea: Secondary | ICD-10-CM | POA: Diagnosis not present

## 2019-10-26 DIAGNOSIS — J45909 Unspecified asthma, uncomplicated: Secondary | ICD-10-CM | POA: Diagnosis not present

## 2019-10-26 DIAGNOSIS — D649 Anemia, unspecified: Secondary | ICD-10-CM

## 2019-10-26 DIAGNOSIS — Z8349 Family history of other endocrine, nutritional and metabolic diseases: Secondary | ICD-10-CM | POA: Diagnosis not present

## 2019-10-26 DIAGNOSIS — Z3A25 25 weeks gestation of pregnancy: Secondary | ICD-10-CM | POA: Insufficient documentation

## 2019-10-26 DIAGNOSIS — O10912 Unspecified pre-existing hypertension complicating pregnancy, second trimester: Secondary | ICD-10-CM | POA: Insufficient documentation

## 2019-10-26 DIAGNOSIS — Z8249 Family history of ischemic heart disease and other diseases of the circulatory system: Secondary | ICD-10-CM | POA: Diagnosis not present

## 2019-10-26 DIAGNOSIS — O99019 Anemia complicating pregnancy, unspecified trimester: Secondary | ICD-10-CM

## 2019-10-26 LAB — CBC
HCT: 28.7 % — ABNORMAL LOW (ref 36.0–46.0)
Hemoglobin: 8.8 g/dL — ABNORMAL LOW (ref 12.0–15.0)
MCH: 27.9 pg (ref 26.0–34.0)
MCHC: 30.7 g/dL (ref 30.0–36.0)
MCV: 91.1 fL (ref 80.0–100.0)
Platelets: 145 10*3/uL — ABNORMAL LOW (ref 150–400)
RBC: 3.15 MIL/uL — ABNORMAL LOW (ref 3.87–5.11)
RDW: 12.6 % (ref 11.5–15.5)
WBC: 7 10*3/uL (ref 4.0–10.5)
nRBC: 0 % (ref 0.0–0.2)

## 2019-10-26 LAB — BASIC METABOLIC PANEL
Anion gap: 9 (ref 5–15)
BUN: 5 mg/dL — ABNORMAL LOW (ref 6–20)
CO2: 24 mmol/L (ref 22–32)
Calcium: 8.8 mg/dL — ABNORMAL LOW (ref 8.9–10.3)
Chloride: 102 mmol/L (ref 98–111)
Creatinine, Ser: 0.66 mg/dL (ref 0.44–1.00)
GFR calc Af Amer: >60 mL/min (ref >60)
GFR calc non Af Amer: >60 mL/min (ref >60)
Glucose, Bld: 82 mg/dL (ref 70–99)
Potassium: 3.7 mmol/L (ref 3.5–5.1)
Sodium: 135 mmol/L (ref 135–145)

## 2019-10-26 LAB — TROPONIN I (HIGH SENSITIVITY): Troponin I (High Sensitivity): 4 ng/L (ref <18)

## 2019-10-26 LAB — PROTIME-INR
INR: 1 (ref 0.8–1.2)
Prothrombin Time: 12.6 seconds (ref 11.4–15.2)

## 2019-10-26 LAB — APTT: aPTT: 25 seconds (ref 24–36)

## 2019-10-26 LAB — BRAIN NATRIURETIC PEPTIDE: B Natriuretic Peptide: 22.8 pg/mL (ref 0.0–100.0)

## 2019-10-26 MED ORDER — IOHEXOL 350 MG/ML SOLN
100.0000 mL | Freq: Once | INTRAVENOUS | Status: AC | PRN
  Administered 2019-10-26: 100 mL via INTRAVENOUS

## 2019-10-26 MED ORDER — ASCORBIC ACID 500 MG PO TABS
500.0000 mg | ORAL_TABLET | ORAL | 3 refills | Status: DC
Start: 2019-10-26 — End: 2020-01-10

## 2019-10-26 MED ORDER — FERROUS SULFATE 325 (65 FE) MG PO TBEC: 325.0000 mg | DELAYED_RELEASE_TABLET | ORAL | 2 refills | Status: AC

## 2019-10-26 NOTE — MAU Provider Note (Signed)
History     CSN: [id number]  Arrival date and time: 10/26/19 1059   First Provider Initiated Contact with Patient 10/26/19 1146      Chief Complaint  Patient presents with  . Shortness of Breath   Joann Jones is a 34 y.o. year old G40P1001 female at [redacted]w[redacted]d weeks gestation who presents to MAU reporting shortness of breath yesterday.  She spoke to Dr. Grace Bushy and MFM about this and was then later called by nursing staff and told to come to MAU to rule out PE.  She states that she feels "fine" today.  She reports that she "always" has some shortness of breath as her abdomen gets larger in pregnancy.  She has a history of SVT chronic hypertension lupus and thrombocytopenia.   OB History    Gravida  2   Para  1   Term  1   Preterm  0   AB      Living  1     SAB      TAB      Ectopic      Multiple      Live Births  1           Past Medical History:  Diagnosis Date  . Anemia   . Anxiety   . Asthma    with exertion  . Collagen vascular disease (HCC)   . Complication of anesthesia    hard to wake up  . Dyspnea   . Dysrhythmia    tachycardia  . GERD (gastroesophageal reflux disease)   . Hypertension   . IBS (irritable bowel syndrome)   . ITP (idiopathic thrombocytopenic purpura)   . Lupus (HCC)   . Migraines   . Palpitations   . Pelvic kidney   . Sleep apnea     Past Surgical History:  Procedure Laterality Date  . COLONOSCOPY WITH PROPOFOL N/A 07/18/2016   Procedure: COLONOSCOPY WITH PROPOFOL;  Surgeon: Scot Jun, MD;  Location: [facility] ENDOSCOPY;  Service: Endoscopy;  Laterality: N/A;  . ESOPHAGOGASTRODUODENOSCOPY (EGD) WITH PROPOFOL N/A 07/18/2016   Procedure: ESOPHAGOGASTRODUODENOSCOPY (EGD) WITH PROPOFOL;  Surgeon: Scot Jun, MD;  Location: [facility] ENDOSCOPY;  Service: Endoscopy;  Laterality: N/A;  . HERNIA REPAIR    . WRIST SURGERY Right     Family History  Problem Relation Age of Onset  . Hypertension Mother   . Hyperlipidemia  Mother   . Hypertension Father   . Hyperlipidemia Father     Social History   Tobacco Use  . Smoking status: Former Smoker    Quit date: 10/21/2005    Years since quitting: 14.0  . Smokeless tobacco: Never Used  Vaping Use  . Vaping Use: Never used  Substance Use Topics  . Alcohol use: Not Currently    Comment: socially  . Drug use: No    Allergies:  Allergies  Allergen Reactions  . Codeine   . Septra [Sulfamethoxazole-Trimethoprim] Other (See Comments)    Decrease platlets    No medications prior to admission.    Review of Systems  Constitutional: Negative.   HENT: Negative.   Eyes: Negative.   Respiratory: Positive for shortness of breath ("on-going and increasing with bigger belly").   Cardiovascular: Negative.   Gastrointestinal: Negative.   Endocrine: Negative.   Genitourinary: Negative.   Musculoskeletal: Negative.   Allergic/Immunologic: Negative.   Neurological: Negative.   Hematological: Negative.   Psychiatric/Behavioral: Negative.    Physical Exam   Blood pressure 124/78, pulse 85,  temperature 98.8 F (37.1 C), temperature source Oral, resp. rate 18, height 5\' 1"  (1.549 m), weight 73.3 kg, SpO2 100 %.  Physical Exam Vitals and nursing note reviewed.  Constitutional:      Appearance: She is well-developed.  HENT:     Head: Normocephalic and atraumatic.  Eyes:     Pupils: Pupils are equal, round, and reactive to light.  Cardiovascular:     Rate and Rhythm: Regular rhythm. Tachycardia present.  Pulmonary:     Effort: Pulmonary effort is normal.     Breath sounds: Normal breath sounds.  Abdominal:     General: Bowel sounds are normal.     Palpations: Abdomen is soft.  Musculoskeletal:        General: Normal range of motion.     Cervical back: Normal range of motion.  Skin:    General: Skin is warm and dry.  Neurological:     Mental Status: She is alert and oriented to person, place, and time.  Psychiatric:        Mood and Affect: Mood  normal.        Behavior: Behavior normal.    REACTIVE NST - FHR: 145 bpm / moderate variability / accels present / decels absent / TOCO: none  MAU Course  Procedures  MDM CBC Troponin I BNP BMP Protime - INR APTT CT Chest PE w and w/o contrast  Results for orders placed or performed during the hospital encounter of 10/26/19 (from the past 24 hour(s))  CBC     Status: Abnormal   Collection Time: 10/26/19 12:20 PM  Result Value Ref Range   WBC 7.0 4.0 - 10.5 K/uL   RBC 3.15 (L) 3.87 - 5.11 MIL/uL   Hemoglobin 8.8 (L) 12.0 - 15.0 g/dL   HCT 10/28/19 (L) 36 - 46 %   MCV 91.1 80.0 - 100.0 fL   MCH 27.9 26.0 - 34.0 pg   MCHC 30.7 30.0 - 36.0 g/dL   RDW 70.9 62.8 - 36.6 %   Platelets 145 (L) 150 - 400 K/uL   nRBC 0.0 0.0 - 0.2 %  Troponin I (High Sensitivity)     Status: None   Collection Time: 10/26/19 12:20 PM  Result Value Ref Range   Troponin I (High Sensitivity) 4 <18 ng/L  Brain natriuretic peptide     Status: None   Collection Time: 10/26/19 12:20 PM  Result Value Ref Range   B Natriuretic Peptide 22.8 0.0 - 100.0 pg/mL  Protime-INR     Status: None   Collection Time: 10/26/19 12:20 PM  Result Value Ref Range   Prothrombin Time 12.6 11.4 - 15.2 seconds   INR 1.0 0.8 - 1.2  Basic metabolic panel     Status: Abnormal   Collection Time: 10/26/19 12:20 PM  Result Value Ref Range   Sodium 135 135 - 145 mmol/L   Potassium 3.7 3.5 - 5.1 mmol/L   Chloride 102 98 - 111 mmol/L   CO2 24 22 - 32 mmol/L   Glucose, Bld 82 70 - 99 mg/dL   BUN 5 (L) 6 - 20 mg/dL   Creatinine, Ser 10/28/19 0.44 - 1.00 mg/dL   Calcium 8.8 (L) 8.9 - 10.3 mg/dL   GFR calc non Af Amer >60 >60 mL/min   GFR calc Af Amer >60 >60 mL/min   Anion gap 9 5 - 15  APTT     Status: None   Collection Time: 10/26/19 12:20 PM  Result Value Ref Range  aPTT 25 24 - 36 seconds    CT Angio Chest PE W and/or Wo Contrast  Result Date: 10/26/2019 CLINICAL DATA:  Chest pain, shortness of breath EXAM: CT ANGIOGRAPHY  CHEST WITH CONTRAST TECHNIQUE: Multidetector CT imaging of the chest was performed using the standard protocol during bolus administration of intravenous contrast. Multiplanar CT image reconstructions and MIPs were obtained to evaluate the vascular anatomy. CONTRAST:  OMNIPAQUE IOHEXOL 350 MG/ML SOLN COMPARISON:  None. FINDINGS: Cardiovascular: Satisfactory opacification of the pulmonary arteries to the segmental level. No evidence of pulmonary embolism. Normal heart size. No pericardial effusion. Pectus excavatum resulting in mild impress on the heart (Haller index of 2.4). Mediastinum/Nodes: No axillary, mediastinal, or hilar lymphadenopathy. Suspect a small amount of residual thymic tissue within the anterior mediastinum. No masslike anterior mediastinal abnormality. No thyroid abnormality identified. Trachea and esophagus within normal limits. Lungs/Pleura: Minimal mosaic ground-glass attenuation within the mid and upper lung fields (for example series 6, image 46). No focal airspace consolidation. No pleural effusion or pneumothorax. Upper Abdomen: No acute abnormality. Musculoskeletal: No chest wall abnormality. No acute or significant osseous findings. Review of the MIP images confirms the above findings. IMPRESSION: 1. Negative for pulmonary embolism. 2. Minimal mosaic ground-glass attenuation within the mid and upper lung fields, which may represent air trapping or small airways disease. 3. Mild pectus excavatum (Haller index of 2.4). Electronically Signed   By: Duanne Guess D.O.   On: 10/26/2019 16:05   Assessment and Plan  Shortness of breath during pregnancy  - Information provided on asthma - Advised to get scheduled with [facility] for breathing test   Anemia during pregnancy - Rx for FeSO4 325 mg every other day  - Rx for Vitamin C 500 mg every other day  - Discharge patient - Keep scheduled appt with [facility] - Patient verbalized an understanding of the  plan of care and agrees.   Raelyn Mora, MSN, CNM 10/26/2019, 11:46 AM

## 2019-10-26 NOTE — Discharge Instructions (Signed)
Please make sure you call [facility] to get the breathing test scheduled.

## 2019-10-26 NOTE — MAU Note (Signed)
Was telling Dr Grace Bushy yesterday, she has had some SOB, that has gotten a little worse.  Later in the day, she was called by one of the office nurses and told she needed to come in to rule out a PE.  Feeling fine today, denies pain, "always" has some SOB, also they wanted her to be seen because her heart rate was elevated, has hx of SVT.

## 2019-11-01 ENCOUNTER — Ambulatory Visit: Payer: BC Managed Care – PPO | Admitting: *Deleted

## 2019-11-01 ENCOUNTER — Ambulatory Visit: Payer: BC Managed Care – PPO | Attending: Obstetrics and Gynecology

## 2019-11-01 ENCOUNTER — Other Ambulatory Visit: Payer: Self-pay

## 2019-11-01 VITALS — BP 107/61 | HR 94

## 2019-11-01 DIAGNOSIS — O365930 Maternal care for other known or suspected poor fetal growth, third trimester, not applicable or unspecified: Secondary | ICD-10-CM | POA: Diagnosis present

## 2019-11-01 DIAGNOSIS — O10919 Unspecified pre-existing hypertension complicating pregnancy, unspecified trimester: Secondary | ICD-10-CM | POA: Insufficient documentation

## 2019-11-01 DIAGNOSIS — O365990 Maternal care for other known or suspected poor fetal growth, unspecified trimester, not applicable or unspecified: Secondary | ICD-10-CM

## 2019-11-01 DIAGNOSIS — O09292 Supervision of pregnancy with other poor reproductive or obstetric history, second trimester: Secondary | ICD-10-CM

## 2019-11-01 DIAGNOSIS — O365920 Maternal care for other known or suspected poor fetal growth, second trimester, not applicable or unspecified: Secondary | ICD-10-CM | POA: Diagnosis not present

## 2019-11-01 DIAGNOSIS — O26892 Other specified pregnancy related conditions, second trimester: Secondary | ICD-10-CM

## 2019-11-01 DIAGNOSIS — O289 Unspecified abnormal findings on antenatal screening of mother: Secondary | ICD-10-CM

## 2019-11-01 DIAGNOSIS — Z3A26 26 weeks gestation of pregnancy: Secondary | ICD-10-CM

## 2019-11-01 DIAGNOSIS — O10012 Pre-existing essential hypertension complicating pregnancy, second trimester: Secondary | ICD-10-CM | POA: Diagnosis not present

## 2019-11-01 DIAGNOSIS — M329 Systemic lupus erythematosus, unspecified: Secondary | ICD-10-CM

## 2019-11-01 NOTE — Procedures (Signed)
Joann Jones 1985-06-03 [redacted]w[redacted]d  Fetus A Non-Stress Test Interpretation for 11/01/19  Indication: IUGR  Fetal Heart Rate A Mode: External Baseline Rate (A): 145 bpm Variability: Moderate Accelerations: None Decelerations: None  Uterine Activity Mode: Toco Contraction Frequency (min): none noted Resting Tone Palpated: Relaxed  Interpretation (Fetal Testing) Nonstress Test Interpretation: Non-reactive Overall Impression: Reassuring for gestational age Comments: FHR tracing rev'd by Dr. Judeth Cornfield

## 2019-11-08 ENCOUNTER — Ambulatory Visit (HOSPITAL_BASED_OUTPATIENT_CLINIC_OR_DEPARTMENT_OTHER): Payer: BC Managed Care – PPO | Admitting: *Deleted

## 2019-11-08 ENCOUNTER — Ambulatory Visit: Payer: BC Managed Care – PPO | Admitting: *Deleted

## 2019-11-08 ENCOUNTER — Other Ambulatory Visit: Payer: Self-pay

## 2019-11-08 ENCOUNTER — Ambulatory Visit: Payer: BC Managed Care – PPO | Attending: Obstetrics and Gynecology

## 2019-11-08 DIAGNOSIS — O09292 Supervision of pregnancy with other poor reproductive or obstetric history, second trimester: Secondary | ICD-10-CM | POA: Diagnosis not present

## 2019-11-08 DIAGNOSIS — O10919 Unspecified pre-existing hypertension complicating pregnancy, unspecified trimester: Secondary | ICD-10-CM | POA: Diagnosis not present

## 2019-11-08 DIAGNOSIS — D6862 Lupus anticoagulant syndrome: Secondary | ICD-10-CM | POA: Insufficient documentation

## 2019-11-08 DIAGNOSIS — Z3A27 27 weeks gestation of pregnancy: Secondary | ICD-10-CM

## 2019-11-08 DIAGNOSIS — O26892 Other specified pregnancy related conditions, second trimester: Secondary | ICD-10-CM | POA: Diagnosis not present

## 2019-11-08 DIAGNOSIS — O365920 Maternal care for other known or suspected poor fetal growth, second trimester, not applicable or unspecified: Secondary | ICD-10-CM | POA: Diagnosis not present

## 2019-11-08 DIAGNOSIS — O10012 Pre-existing essential hypertension complicating pregnancy, second trimester: Secondary | ICD-10-CM

## 2019-11-08 DIAGNOSIS — O99019 Anemia complicating pregnancy, unspecified trimester: Secondary | ICD-10-CM | POA: Insufficient documentation

## 2019-11-08 DIAGNOSIS — O99112 Other diseases of the blood and blood-forming organs and certain disorders involving the immune mechanism complicating pregnancy, second trimester: Secondary | ICD-10-CM | POA: Insufficient documentation

## 2019-11-08 DIAGNOSIS — O289 Unspecified abnormal findings on antenatal screening of mother: Secondary | ICD-10-CM

## 2019-11-08 DIAGNOSIS — M329 Systemic lupus erythematosus, unspecified: Secondary | ICD-10-CM

## 2019-11-08 DIAGNOSIS — O10912 Unspecified pre-existing hypertension complicating pregnancy, second trimester: Secondary | ICD-10-CM

## 2019-11-08 NOTE — Progress Notes (Signed)
Denies pain, bleeding, leaking. +FM. 

## 2019-11-08 NOTE — Procedures (Signed)
Joann Jones 11-15-1985 [redacted]w[redacted]d  Fetus A Non-Stress Test Interpretation for 11/08/19  Indication: Chronic Hypertenstion  Fetal Heart Rate A Mode: External Baseline Rate (A): 145 bpm Variability: Moderate Accelerations: None Decelerations: None  Uterine Activity Mode: Palpation, Toco Contraction Frequency (min): None Resting Tone Palpated: Relaxed Resting Time: Adequate  Interpretation (Fetal Testing) Overall Impression: Reassuring for gestational age Comments: Tracing reviewed by Dr. Parke Poisson

## 2019-11-10 ENCOUNTER — Ambulatory Visit: Payer: BC Managed Care – PPO

## 2019-11-15 ENCOUNTER — Other Ambulatory Visit: Payer: Self-pay

## 2019-11-15 ENCOUNTER — Encounter: Payer: Self-pay | Admitting: *Deleted

## 2019-11-15 ENCOUNTER — Ambulatory Visit: Payer: BC Managed Care – PPO | Admitting: *Deleted

## 2019-11-15 ENCOUNTER — Ambulatory Visit: Payer: BC Managed Care – PPO | Attending: Obstetrics and Gynecology

## 2019-11-15 ENCOUNTER — Other Ambulatory Visit: Payer: Self-pay | Admitting: Maternal & Fetal Medicine

## 2019-11-15 VITALS — BP 112/57 | HR 98

## 2019-11-15 DIAGNOSIS — O365930 Maternal care for other known or suspected poor fetal growth, third trimester, not applicable or unspecified: Secondary | ICD-10-CM | POA: Insufficient documentation

## 2019-11-15 DIAGNOSIS — O10913 Unspecified pre-existing hypertension complicating pregnancy, third trimester: Secondary | ICD-10-CM | POA: Diagnosis not present

## 2019-11-15 DIAGNOSIS — O365990 Maternal care for other known or suspected poor fetal growth, unspecified trimester, not applicable or unspecified: Secondary | ICD-10-CM | POA: Insufficient documentation

## 2019-11-15 DIAGNOSIS — Z3A28 28 weeks gestation of pregnancy: Secondary | ICD-10-CM

## 2019-11-15 DIAGNOSIS — O10919 Unspecified pre-existing hypertension complicating pregnancy, unspecified trimester: Secondary | ICD-10-CM | POA: Diagnosis not present

## 2019-11-15 DIAGNOSIS — O99891 Other specified diseases and conditions complicating pregnancy: Secondary | ICD-10-CM | POA: Diagnosis not present

## 2019-11-15 DIAGNOSIS — O289 Unspecified abnormal findings on antenatal screening of mother: Secondary | ICD-10-CM

## 2019-11-15 DIAGNOSIS — M329 Systemic lupus erythematosus, unspecified: Secondary | ICD-10-CM

## 2019-11-15 NOTE — Procedures (Signed)
Joann Jones [birth year] [redacted]w[redacted]d  Fetus A Non-Stress Test Interpretation for 11/15/19  Indication: IUGR and Chronic Hypertenstion  Fetal Heart Rate A Mode: External Baseline Rate (A): 150 bpm Variability: Moderate Accelerations: None Decelerations: Early Multiple birth?: No  Uterine Activity Mode: Palpation, Toco Contraction Frequency (min): Occas UI Contraction Quality: Mild Resting Tone Palpated: Relaxed Resting Time: Adequate  Interpretation (Fetal Testing) Nonstress Test Interpretation: Non-reactive Comments: Tracing reviewed by Dr. Parke Poisson

## 2019-11-16 ENCOUNTER — Encounter: Payer: Self-pay | Admitting: Obstetrics

## 2019-11-16 ENCOUNTER — Other Ambulatory Visit (HOSPITAL_COMMUNITY): Payer: Self-pay

## 2019-11-16 NOTE — Discharge Instructions (Signed)

## 2019-11-17 ENCOUNTER — Encounter (HOSPITAL_COMMUNITY)
Admission: RE | Admit: 2019-11-17 | Discharge: 2019-11-17 | Disposition: A | Discharge status: Home / Self Care | Payer: BC Managed Care – PPO | Source: Ambulatory Visit | Attending: Obstetrics and Gynecology | Admitting: Obstetrics and Gynecology

## 2019-11-17 DIAGNOSIS — Z3A00 Weeks of gestation of pregnancy not specified: Secondary | ICD-10-CM | POA: Insufficient documentation

## 2019-11-17 DIAGNOSIS — O99019 Anemia complicating pregnancy, unspecified trimester: Secondary | ICD-10-CM | POA: Insufficient documentation

## 2019-11-17 DIAGNOSIS — D649 Anemia, unspecified: Secondary | ICD-10-CM | POA: Diagnosis not present

## 2019-11-17 MED ORDER — SODIUM CHLORIDE 0.9 % IV SOLN
510.0000 mg | INTRAVENOUS | Status: DC
  Administered 2019-11-17: 510 mg via INTRAVENOUS
  Filled 2019-11-17: qty 17

## 2019-11-22 ENCOUNTER — Other Ambulatory Visit: Payer: Self-pay | Admitting: *Deleted

## 2019-11-22 ENCOUNTER — Ambulatory Visit: Payer: BC Managed Care – PPO | Admitting: *Deleted

## 2019-11-22 ENCOUNTER — Other Ambulatory Visit: Payer: Self-pay

## 2019-11-22 ENCOUNTER — Ambulatory Visit: Payer: BC Managed Care – PPO

## 2019-11-22 ENCOUNTER — Encounter: Payer: Self-pay | Admitting: *Deleted

## 2019-11-22 ENCOUNTER — Ambulatory Visit: Payer: BC Managed Care – PPO | Attending: Obstetrics and Gynecology

## 2019-11-22 ENCOUNTER — Other Ambulatory Visit: Payer: Self-pay | Admitting: Maternal & Fetal Medicine

## 2019-11-22 VITALS — BP 109/68 | HR 106

## 2019-11-22 DIAGNOSIS — O10913 Unspecified pre-existing hypertension complicating pregnancy, third trimester: Secondary | ICD-10-CM | POA: Diagnosis not present

## 2019-11-22 DIAGNOSIS — O10919 Unspecified pre-existing hypertension complicating pregnancy, unspecified trimester: Secondary | ICD-10-CM

## 2019-11-22 DIAGNOSIS — O26892 Other specified pregnancy related conditions, second trimester: Secondary | ICD-10-CM

## 2019-11-22 DIAGNOSIS — O365930 Maternal care for other known or suspected poor fetal growth, third trimester, not applicable or unspecified: Secondary | ICD-10-CM

## 2019-11-22 DIAGNOSIS — I10 Essential (primary) hypertension: Secondary | ICD-10-CM

## 2019-11-22 DIAGNOSIS — M329 Systemic lupus erythematosus, unspecified: Secondary | ICD-10-CM | POA: Diagnosis not present

## 2019-11-22 DIAGNOSIS — Z3A29 29 weeks gestation of pregnancy: Secondary | ICD-10-CM

## 2019-11-24 ENCOUNTER — Inpatient Hospital Stay (HOSPITAL_COMMUNITY): Admission: RE | Admit: 2019-11-24 | Payer: BC Managed Care – PPO | Source: Ambulatory Visit

## 2019-11-25 ENCOUNTER — Encounter (HOSPITAL_COMMUNITY)
Admission: RE | Admit: 2019-11-25 | Discharge: 2019-11-25 | Disposition: A | Discharge status: Home / Self Care | Payer: BC Managed Care – PPO | Source: Ambulatory Visit | Attending: Obstetrics and Gynecology | Admitting: Obstetrics and Gynecology

## 2019-11-25 DIAGNOSIS — O99019 Anemia complicating pregnancy, unspecified trimester: Secondary | ICD-10-CM | POA: Diagnosis not present

## 2019-11-25 MED ORDER — SODIUM CHLORIDE 0.9 % IV SOLN
510.0000 mg | INTRAVENOUS | Status: DC
  Administered 2019-11-25: 510 mg via INTRAVENOUS
  Filled 2019-11-25: qty 17

## 2019-11-29 ENCOUNTER — Ambulatory Visit: Payer: BC Managed Care – PPO

## 2019-11-29 ENCOUNTER — Encounter: Payer: Self-pay | Admitting: *Deleted

## 2019-11-29 ENCOUNTER — Other Ambulatory Visit: Payer: Self-pay

## 2019-11-29 ENCOUNTER — Ambulatory Visit: Payer: BC Managed Care – PPO | Attending: Obstetrics and Gynecology

## 2019-11-29 ENCOUNTER — Ambulatory Visit: Payer: BC Managed Care – PPO | Admitting: *Deleted

## 2019-11-29 VITALS — BP 112/70 | HR 85

## 2019-11-29 DIAGNOSIS — O10913 Unspecified pre-existing hypertension complicating pregnancy, third trimester: Secondary | ICD-10-CM | POA: Diagnosis not present

## 2019-11-29 DIAGNOSIS — O10919 Unspecified pre-existing hypertension complicating pregnancy, unspecified trimester: Secondary | ICD-10-CM | POA: Insufficient documentation

## 2019-11-29 DIAGNOSIS — O365930 Maternal care for other known or suspected poor fetal growth, third trimester, not applicable or unspecified: Secondary | ICD-10-CM

## 2019-11-29 DIAGNOSIS — O99113 Other diseases of the blood and blood-forming organs and certain disorders involving the immune mechanism complicating pregnancy, third trimester: Secondary | ICD-10-CM | POA: Diagnosis not present

## 2019-11-29 DIAGNOSIS — M329 Systemic lupus erythematosus, unspecified: Secondary | ICD-10-CM

## 2019-11-29 DIAGNOSIS — I10 Essential (primary) hypertension: Secondary | ICD-10-CM

## 2019-11-29 DIAGNOSIS — Z3A30 30 weeks gestation of pregnancy: Secondary | ICD-10-CM

## 2019-12-06 ENCOUNTER — Ambulatory Visit: Payer: BC Managed Care – PPO | Admitting: *Deleted

## 2019-12-06 ENCOUNTER — Ambulatory Visit: Payer: BC Managed Care – PPO | Attending: Obstetrics and Gynecology

## 2019-12-06 ENCOUNTER — Other Ambulatory Visit: Payer: Self-pay | Admitting: *Deleted

## 2019-12-06 ENCOUNTER — Other Ambulatory Visit: Payer: Self-pay

## 2019-12-06 VITALS — BP 123/77 | HR 106

## 2019-12-06 DIAGNOSIS — O10013 Pre-existing essential hypertension complicating pregnancy, third trimester: Secondary | ICD-10-CM

## 2019-12-06 DIAGNOSIS — O10919 Unspecified pre-existing hypertension complicating pregnancy, unspecified trimester: Secondary | ICD-10-CM

## 2019-12-06 DIAGNOSIS — O26893 Other specified pregnancy related conditions, third trimester: Secondary | ICD-10-CM

## 2019-12-06 DIAGNOSIS — Z3A31 31 weeks gestation of pregnancy: Secondary | ICD-10-CM

## 2019-12-06 DIAGNOSIS — I10 Essential (primary) hypertension: Secondary | ICD-10-CM | POA: Insufficient documentation

## 2019-12-06 DIAGNOSIS — M329 Systemic lupus erythematosus, unspecified: Secondary | ICD-10-CM | POA: Diagnosis not present

## 2019-12-06 DIAGNOSIS — O365930 Maternal care for other known or suspected poor fetal growth, third trimester, not applicable or unspecified: Secondary | ICD-10-CM

## 2019-12-06 DIAGNOSIS — O365931 Maternal care for other known or suspected poor fetal growth, third trimester, fetus 1: Secondary | ICD-10-CM | POA: Diagnosis not present

## 2019-12-15 ENCOUNTER — Other Ambulatory Visit: Payer: Self-pay

## 2019-12-15 ENCOUNTER — Ambulatory Visit: Payer: BC Managed Care – PPO | Attending: Obstetrics and Gynecology

## 2019-12-15 ENCOUNTER — Ambulatory Visit: Payer: BC Managed Care – PPO | Admitting: *Deleted

## 2019-12-15 VITALS — BP 120/74 | HR 99

## 2019-12-15 DIAGNOSIS — O10919 Unspecified pre-existing hypertension complicating pregnancy, unspecified trimester: Secondary | ICD-10-CM | POA: Diagnosis present

## 2019-12-15 DIAGNOSIS — I10 Essential (primary) hypertension: Secondary | ICD-10-CM

## 2019-12-15 DIAGNOSIS — Z3A32 32 weeks gestation of pregnancy: Secondary | ICD-10-CM

## 2019-12-15 DIAGNOSIS — M329 Systemic lupus erythematosus, unspecified: Secondary | ICD-10-CM

## 2019-12-15 DIAGNOSIS — O365930 Maternal care for other known or suspected poor fetal growth, third trimester, not applicable or unspecified: Secondary | ICD-10-CM

## 2019-12-15 DIAGNOSIS — O99113 Other diseases of the blood and blood-forming organs and certain disorders involving the immune mechanism complicating pregnancy, third trimester: Secondary | ICD-10-CM | POA: Diagnosis not present

## 2019-12-15 DIAGNOSIS — O10913 Unspecified pre-existing hypertension complicating pregnancy, third trimester: Secondary | ICD-10-CM | POA: Diagnosis not present

## 2019-12-20 ENCOUNTER — Ambulatory Visit: Payer: BC Managed Care – PPO | Attending: Obstetrics and Gynecology

## 2019-12-20 ENCOUNTER — Other Ambulatory Visit: Payer: Self-pay

## 2019-12-20 ENCOUNTER — Encounter: Payer: Self-pay | Admitting: *Deleted

## 2019-12-20 ENCOUNTER — Ambulatory Visit: Payer: BC Managed Care – PPO | Admitting: *Deleted

## 2019-12-20 VITALS — BP 113/76 | HR 110

## 2019-12-20 DIAGNOSIS — Z3A33 33 weeks gestation of pregnancy: Secondary | ICD-10-CM

## 2019-12-20 DIAGNOSIS — O10913 Unspecified pre-existing hypertension complicating pregnancy, third trimester: Secondary | ICD-10-CM | POA: Diagnosis not present

## 2019-12-20 DIAGNOSIS — I10 Essential (primary) hypertension: Secondary | ICD-10-CM

## 2019-12-20 DIAGNOSIS — O365930 Maternal care for other known or suspected poor fetal growth, third trimester, not applicable or unspecified: Secondary | ICD-10-CM

## 2019-12-20 DIAGNOSIS — M329 Systemic lupus erythematosus, unspecified: Secondary | ICD-10-CM

## 2019-12-20 DIAGNOSIS — O26893 Other specified pregnancy related conditions, third trimester: Secondary | ICD-10-CM | POA: Diagnosis not present

## 2019-12-27 ENCOUNTER — Encounter: Payer: Self-pay | Admitting: *Deleted

## 2019-12-27 ENCOUNTER — Other Ambulatory Visit: Payer: Self-pay

## 2019-12-27 ENCOUNTER — Ambulatory Visit: Payer: BC Managed Care – PPO | Attending: Obstetrics and Gynecology

## 2019-12-27 ENCOUNTER — Ambulatory Visit: Payer: BC Managed Care – PPO | Admitting: *Deleted

## 2019-12-27 VITALS — BP 124/78 | HR 99

## 2019-12-27 DIAGNOSIS — O10913 Unspecified pre-existing hypertension complicating pregnancy, third trimester: Secondary | ICD-10-CM

## 2019-12-27 DIAGNOSIS — O365930 Maternal care for other known or suspected poor fetal growth, third trimester, not applicable or unspecified: Secondary | ICD-10-CM

## 2019-12-27 DIAGNOSIS — D696 Thrombocytopenia, unspecified: Secondary | ICD-10-CM

## 2019-12-27 DIAGNOSIS — O365931 Maternal care for other known or suspected poor fetal growth, third trimester, fetus 1: Secondary | ICD-10-CM

## 2019-12-27 DIAGNOSIS — Z3A34 34 weeks gestation of pregnancy: Secondary | ICD-10-CM

## 2019-12-27 DIAGNOSIS — O99113 Other diseases of the blood and blood-forming organs and certain disorders involving the immune mechanism complicating pregnancy, third trimester: Secondary | ICD-10-CM | POA: Diagnosis not present

## 2019-12-27 DIAGNOSIS — O26893 Other specified pregnancy related conditions, third trimester: Secondary | ICD-10-CM | POA: Diagnosis not present

## 2019-12-27 DIAGNOSIS — O10013 Pre-existing essential hypertension complicating pregnancy, third trimester: Secondary | ICD-10-CM | POA: Diagnosis not present

## 2019-12-27 DIAGNOSIS — M329 Systemic lupus erythematosus, unspecified: Secondary | ICD-10-CM

## 2019-12-28 ENCOUNTER — Other Ambulatory Visit: Payer: Self-pay | Admitting: *Deleted

## 2019-12-28 DIAGNOSIS — O365930 Maternal care for other known or suspected poor fetal growth, third trimester, not applicable or unspecified: Secondary | ICD-10-CM

## 2020-01-03 ENCOUNTER — Ambulatory Visit: Payer: BC Managed Care – PPO | Admitting: *Deleted

## 2020-01-03 ENCOUNTER — Ambulatory Visit: Payer: BC Managed Care – PPO | Attending: Maternal & Fetal Medicine

## 2020-01-03 ENCOUNTER — Encounter: Payer: Self-pay | Admitting: *Deleted

## 2020-01-03 ENCOUNTER — Other Ambulatory Visit: Payer: Self-pay

## 2020-01-03 VITALS — BP 132/77 | HR 93

## 2020-01-03 DIAGNOSIS — I10 Essential (primary) hypertension: Secondary | ICD-10-CM | POA: Insufficient documentation

## 2020-01-03 DIAGNOSIS — O99113 Other diseases of the blood and blood-forming organs and certain disorders involving the immune mechanism complicating pregnancy, third trimester: Secondary | ICD-10-CM

## 2020-01-03 DIAGNOSIS — O26893 Other specified pregnancy related conditions, third trimester: Secondary | ICD-10-CM

## 2020-01-03 DIAGNOSIS — O10913 Unspecified pre-existing hypertension complicating pregnancy, third trimester: Secondary | ICD-10-CM | POA: Diagnosis not present

## 2020-01-03 DIAGNOSIS — O365930 Maternal care for other known or suspected poor fetal growth, third trimester, not applicable or unspecified: Secondary | ICD-10-CM | POA: Diagnosis present

## 2020-01-03 DIAGNOSIS — M329 Systemic lupus erythematosus, unspecified: Secondary | ICD-10-CM

## 2020-01-03 DIAGNOSIS — Z3A35 35 weeks gestation of pregnancy: Secondary | ICD-10-CM

## 2020-01-03 DIAGNOSIS — D696 Thrombocytopenia, unspecified: Secondary | ICD-10-CM

## 2020-01-09 ENCOUNTER — Inpatient Hospital Stay (EMERGENCY_DEPARTMENT_HOSPITAL)
Admission: AD | Admit: 2020-01-09 | Discharge: 2020-01-10 | Disposition: A | Discharge status: Home / Self Care | Payer: BC Managed Care – PPO | Source: Home / Self Care | Attending: Obstetrics and Gynecology | Admitting: Obstetrics and Gynecology

## 2020-01-09 ENCOUNTER — Encounter (HOSPITAL_COMMUNITY): Payer: Self-pay | Admitting: Obstetrics and Gynecology

## 2020-01-09 DIAGNOSIS — O10919 Unspecified pre-existing hypertension complicating pregnancy, unspecified trimester: Secondary | ICD-10-CM

## 2020-01-09 DIAGNOSIS — Z3A36 36 weeks gestation of pregnancy: Secondary | ICD-10-CM

## 2020-01-09 DIAGNOSIS — Z3689 Encounter for other specified antenatal screening: Secondary | ICD-10-CM

## 2020-01-09 NOTE — MAU Note (Signed)
PT SAYS WENT TO DR CLOWSE- TODAY - LABS, UA-  SHE CALLED PT 10PM- TOLD HER PC RATIO WAS HIGH.  Marland Kitchen DENIES  ANY LABOR C/O.

## 2020-01-10 ENCOUNTER — Ambulatory Visit: Payer: BC Managed Care – PPO

## 2020-01-10 ENCOUNTER — Ambulatory Visit: Payer: BC Managed Care – PPO | Admitting: *Deleted

## 2020-01-10 ENCOUNTER — Encounter: Payer: Self-pay | Admitting: *Deleted

## 2020-01-10 ENCOUNTER — Ambulatory Visit (HOSPITAL_BASED_OUTPATIENT_CLINIC_OR_DEPARTMENT_OTHER): Payer: BC Managed Care – PPO

## 2020-01-10 ENCOUNTER — Other Ambulatory Visit: Payer: Self-pay

## 2020-01-10 DIAGNOSIS — Z3A36 36 weeks gestation of pregnancy: Secondary | ICD-10-CM | POA: Diagnosis not present

## 2020-01-10 DIAGNOSIS — O10913 Unspecified pre-existing hypertension complicating pregnancy, third trimester: Secondary | ICD-10-CM

## 2020-01-10 DIAGNOSIS — O365930 Maternal care for other known or suspected poor fetal growth, third trimester, not applicable or unspecified: Secondary | ICD-10-CM | POA: Insufficient documentation

## 2020-01-10 DIAGNOSIS — O10013 Pre-existing essential hypertension complicating pregnancy, third trimester: Secondary | ICD-10-CM | POA: Diagnosis not present

## 2020-01-10 DIAGNOSIS — D696 Thrombocytopenia, unspecified: Secondary | ICD-10-CM

## 2020-01-10 DIAGNOSIS — M329 Systemic lupus erythematosus, unspecified: Secondary | ICD-10-CM

## 2020-01-10 DIAGNOSIS — O99113 Other diseases of the blood and blood-forming organs and certain disorders involving the immune mechanism complicating pregnancy, third trimester: Secondary | ICD-10-CM | POA: Diagnosis not present

## 2020-01-10 DIAGNOSIS — O26893 Other specified pregnancy related conditions, third trimester: Secondary | ICD-10-CM | POA: Diagnosis not present

## 2020-01-10 MED ORDER — BETAMETHASONE SOD PHOS & ACET 6 (3-3) MG/ML IJ SUSP
12.0000 mg | Freq: Once | INTRAMUSCULAR | Status: AC
  Administered 2020-01-10: 12 mg via INTRAMUSCULAR

## 2020-01-10 NOTE — Discharge Instructions (Signed)

## 2020-01-10 NOTE — MAU Provider Note (Addendum)
History     CSN: [id number]  Arrival date and time: 01/09/20 2306   First Provider Initiated Contact with Patient 01/10/20 0039      Chief Complaint  Patient presents with  . Follow-up   34 y.o. G2P1001 @36 .3 wks presenting per the instruction of her doctor. Was told by her Rheumatologist Dr. that her protein creatinine ratio was 777 and she should come to the hospital. Pt has hx of CHTN, SLE, SVT, and ITP. Denies HA, visual disturbances, RUQ pain, SOB, and CP. Reports good FM. No pregnancy complaints.    OB History    Gravida  2   Para  1   Term  1   Preterm  0   AB      Living  1     SAB      TAB      Ectopic      Multiple      Live Births  1           Past Medical History:  Diagnosis Date  . Anemia   . Anxiety   . Asthma    with exertion  . Collagen vascular disease (HCC)   . Complication of anesthesia    hard to wake up  . Dyspnea   . Dysrhythmia    tachycardia  . GERD (gastroesophageal reflux disease)   . Hypertension   . IBS (irritable bowel syndrome)   . ITP (idiopathic thrombocytopenic purpura)   . Lupus (HCC)   . Migraines   . Palpitations   . Pelvic kidney   . Sleep apnea     Past Surgical History:  Procedure Laterality Date  . COLONOSCOPY WITH PROPOFOL N/A 07/18/2016   Procedure: COLONOSCOPY WITH PROPOFOL;  Surgeon: 07/20/2016, MD;  Location[facility] ENDOSCOPY;  Service: Endoscopy;  Laterality: N/A;  . ESOPHAGOGASTRODUODENOSCOPY (EGD) WITH PROPOFOL N/A 07/18/2016   Procedure: ESOPHAGOGASTRODUODENOSCOPY (EGD) WITH PROPOFOL;  Surgeon: 07/20/2016, MD;  Location: [facility] ENDOSCOPY;  Service: Endoscopy;  Laterality: N/A;  . HERNIA REPAIR    . WRIST SURGERY Right     Family History  Problem Relation Age of Onset  . Hypertension Mother   . Hyperlipidemia Mother   . Hypertension Father   . Hyperlipidemia Father     Social History   Tobacco Use  . Smoking status: Former Smoker    Quit date: 10/21/2005    Years  since quitting: 14.2  . Smokeless tobacco: Never Used  Vaping Use  . Vaping Use: Never used  Substance Use Topics  . Alcohol use: Not Currently    Comment: socially  . Drug use: No    Allergies:  Allergies  Allergen Reactions  . Codeine   . Septra [Sulfamethoxazole-Trimethoprim] Other (See Comments)    Decrease platlets  . Tegaderm Ag Mesh [Silver] Itching    Medications Prior to Admission  Medication Sig Dispense Refill Last Dose  . amLODipine (NORVASC) 2.5 MG tablet Take 2.5 mg by mouth daily.   01/08/2020 at 2300  . aspirin EC 81 MG tablet Take 81 mg by mouth daily.   01/08/2020 at 2300  . Doxylamine-Pyridoxine (DICLEGIS PO) Take by mouth.   01/08/2020 at Unknown time  . ferrous sulfate 325 (65 FE) MG EC tablet Take 1 tablet (325 mg total) by mouth every other day. 30 tablet 2 01/08/2020 at Unknown time  . hydroxychloroquine (PLAQUENIL) 200 MG tablet Take by mouth daily.   01/08/2020 at Unknown time  . omeprazole (PRILOSEC) 40 MG  capsule Take by mouth.   01/08/2020 at Unknown time  . Prenatal Vit-Fe Fumarate-FA (PRENATAL VITAMINS PO) Take by mouth.   01/08/2020 at Unknown time  . sertraline (ZOLOFT) 100 MG tablet Take by mouth at bedtime.    01/08/2020 at Unknown time  . ascorbic acid (VITAMIN C) 500 MG tablet Take 1 tablet (500 mg total) by mouth every other day. Take with iron pill (Patient not taking: Reported on 11/01/2019) 30 tablet 3 More than a month at Unknown time  . ondansetron (ZOFRAN-ODT) 8 MG disintegrating tablet Take 1 tablet (8 mg total) by mouth every 8 (eight) hours as needed for nausea or vomiting. (Patient not taking: Reported on 07/20/2019) 20 tablet 0 More than a month at Unknown time  . PROGESTERONE VA Place vaginally.    More than a month at Unknown time    Review of Systems  Eyes: Negative for visual disturbance.  Respiratory: Negative for shortness of breath.   Cardiovascular: Negative for chest pain.  Gastrointestinal: Negative for abdominal pain.   Genitourinary: Negative for vaginal bleeding.  Neurological: Negative for headaches.   Physical Exam   Blood pressure 117/79, pulse 94, temperature 98.4 F (36.9 C), resp. rate 20, height 5\' 1"  (1.549 m), weight 77.5 kg. Patient Vitals for the past 24 hrs:  BP Temp Pulse Resp Height Weight  01/09/20 2347 138/77 -- 100 -- -- --  01/09/20 2318 117/79 98.4 F (36.9 C) 94 20 5\' 1"  (1.549 m) 77.5 kg   Physical Exam Vitals and nursing note reviewed.  Constitutional:      General: She is not in acute distress.    Appearance: Normal appearance.  HENT:     Head: Normocephalic and atraumatic.  Cardiovascular:     Rate and Rhythm: Normal rate.  Pulmonary:     Effort: Pulmonary effort is normal. No respiratory distress.  Musculoskeletal:        General: Normal range of motion.     Cervical back: Normal range of motion.  Skin:    General: Skin is warm and dry.  Neurological:     General: No focal deficit present.     Mental Status: She is alert and oriented to person, place, and time.  Psychiatric:        Mood and Affect: Mood normal.        Behavior: Behavior normal.   EFM: 140 bpm, mod variability, + accels, no decels Toco: irreg  No results found for this or any previous visit (from the past 24 hour(s)).  MAU Course  Procedures  MDM Review of lab results on patients phone from today (10/4), normal LFTs, normal creatinine, CBC normal except plt 115, and PCR 777. New onset of proteinuria may indicate superimposed PEC however without signs of severe features admission would not be recommended. Discussed presentation and clinical findings with Dr. and agrees discharge home and f/u this week in office as planned. Pt agrees with plan. Stable for discharge home.   Assessment and Plan   1. [redacted] weeks gestation of pregnancy   2. NST (non-stress test) reactive   3. Chronic hypertension affecting pregnancy    Discharge home Follow up at MFM tomorrow as scheduled Follow up at  [facility] on Thursday PEC precautions  Allergies as of 01/10/2020      Reactions   Codeine    Septra [sulfamethoxazole-trimethoprim] Other (See Comments)   Decrease platlets   Tegaderm Ag Mesh [silver] Itching      Medication List  STOP taking these medications   ascorbic acid 500 MG tablet Commonly known as: VITAMIN C   ondansetron 8 MG disintegrating tablet Commonly known as: ZOFRAN-ODT     TAKE these medications   amLODipine 2.5 MG tablet Commonly known as: NORVASC Take 2.5 mg by mouth daily.   aspirin EC 81 MG tablet Take 81 mg by mouth daily.   DICLEGIS PO Take by mouth.   ferrous sulfate 325 (65 FE) MG EC tablet Take 1 tablet (325 mg total) by mouth every other day.   hydroxychloroquine 200 MG tablet Commonly known as: PLAQUENIL Take by mouth daily.   omeprazole 40 MG capsule Commonly known as: PRILOSEC Take by mouth.   PRENATAL VITAMINS PO Take by mouth.   PROGESTERONE VA Place vaginally.   sertraline 100 MG tablet Commonly known as: ZOLOFT Take by mouth at bedtime.      Donette Larry, CNM 01/10/2020, 12:44 AM

## 2020-01-11 ENCOUNTER — Inpatient Hospital Stay (HOSPITAL_COMMUNITY)
Admission: AD | Admit: 2020-01-11 | Discharge: 2020-01-11 | Disposition: A | Discharge status: Home / Self Care | Payer: BC Managed Care – PPO | Source: Home / Self Care | Attending: Obstetrics and Gynecology | Admitting: Obstetrics and Gynecology

## 2020-01-11 ENCOUNTER — Ambulatory Visit: Payer: BC Managed Care – PPO | Attending: Obstetrics and Gynecology

## 2020-01-11 ENCOUNTER — Other Ambulatory Visit: Payer: Self-pay | Admitting: Obstetrics and Gynecology

## 2020-01-11 DIAGNOSIS — K589 Irritable bowel syndrome without diarrhea: Secondary | ICD-10-CM

## 2020-01-11 DIAGNOSIS — Z7982 Long term (current) use of aspirin: Secondary | ICD-10-CM | POA: Insufficient documentation

## 2020-01-11 DIAGNOSIS — O10913 Unspecified pre-existing hypertension complicating pregnancy, third trimester: Secondary | ICD-10-CM | POA: Insufficient documentation

## 2020-01-11 DIAGNOSIS — F419 Anxiety disorder, unspecified: Secondary | ICD-10-CM | POA: Insufficient documentation

## 2020-01-11 DIAGNOSIS — Z882 Allergy status to sulfonamides status: Secondary | ICD-10-CM | POA: Insufficient documentation

## 2020-01-11 DIAGNOSIS — O99613 Diseases of the digestive system complicating pregnancy, third trimester: Secondary | ICD-10-CM | POA: Insufficient documentation

## 2020-01-11 DIAGNOSIS — Z79899 Other long term (current) drug therapy: Secondary | ICD-10-CM | POA: Insufficient documentation

## 2020-01-11 DIAGNOSIS — O99343 Other mental disorders complicating pregnancy, third trimester: Secondary | ICD-10-CM | POA: Insufficient documentation

## 2020-01-11 DIAGNOSIS — M329 Systemic lupus erythematosus, unspecified: Secondary | ICD-10-CM

## 2020-01-11 DIAGNOSIS — Z3A36 36 weeks gestation of pregnancy: Secondary | ICD-10-CM | POA: Insufficient documentation

## 2020-01-11 DIAGNOSIS — O99113 Other diseases of the blood and blood-forming organs and certain disorders involving the immune mechanism complicating pregnancy, third trimester: Secondary | ICD-10-CM | POA: Insufficient documentation

## 2020-01-11 DIAGNOSIS — Z885 Allergy status to narcotic agent status: Secondary | ICD-10-CM | POA: Insufficient documentation

## 2020-01-11 DIAGNOSIS — R519 Headache, unspecified: Secondary | ICD-10-CM | POA: Insufficient documentation

## 2020-01-11 DIAGNOSIS — D693 Immune thrombocytopenic purpura: Secondary | ICD-10-CM | POA: Insufficient documentation

## 2020-01-11 DIAGNOSIS — Z881 Allergy status to other antibiotic agents status: Secondary | ICD-10-CM | POA: Insufficient documentation

## 2020-01-11 DIAGNOSIS — O26893 Other specified pregnancy related conditions, third trimester: Secondary | ICD-10-CM | POA: Insufficient documentation

## 2020-01-11 DIAGNOSIS — R197 Diarrhea, unspecified: Secondary | ICD-10-CM | POA: Insufficient documentation

## 2020-01-11 DIAGNOSIS — O99891 Other specified diseases and conditions complicating pregnancy: Secondary | ICD-10-CM | POA: Insufficient documentation

## 2020-01-11 DIAGNOSIS — Z87891 Personal history of nicotine dependence: Secondary | ICD-10-CM | POA: Insufficient documentation

## 2020-01-11 DIAGNOSIS — Z20822 Contact with and (suspected) exposure to covid-19: Secondary | ICD-10-CM | POA: Insufficient documentation

## 2020-01-11 DIAGNOSIS — O212 Late vomiting of pregnancy: Secondary | ICD-10-CM | POA: Insufficient documentation

## 2020-01-11 DIAGNOSIS — Z3689 Encounter for other specified antenatal screening: Secondary | ICD-10-CM

## 2020-01-11 DIAGNOSIS — O10919 Unspecified pre-existing hypertension complicating pregnancy, unspecified trimester: Secondary | ICD-10-CM

## 2020-01-11 DIAGNOSIS — K219 Gastro-esophageal reflux disease without esophagitis: Secondary | ICD-10-CM

## 2020-01-11 LAB — RESPIRATORY PANEL BY RT PCR (FLU A&B, COVID)
Influenza A by PCR: NEGATIVE
Influenza B by PCR: NEGATIVE
SARS Coronavirus 2 by RT PCR: NEGATIVE

## 2020-01-11 LAB — CBC
HCT: 38.7 % (ref 36.0–46.0)
Hemoglobin: 12.7 g/dL (ref 12.0–15.0)
MCH: 30.5 pg (ref 26.0–34.0)
MCHC: 32.8 g/dL (ref 30.0–36.0)
MCV: 93 fL (ref 80.0–100.0)
Platelets: 126 10*3/uL — ABNORMAL LOW (ref 150–400)
RBC: 4.16 MIL/uL (ref 3.87–5.11)
RDW: 14.9 % (ref 11.5–15.5)
WBC: 8.2 10*3/uL (ref 4.0–10.5)
nRBC: 0 % (ref 0.0–0.2)

## 2020-01-11 LAB — COMPREHENSIVE METABOLIC PANEL
ALT: 21 U/L (ref 0–44)
AST: 30 U/L (ref 15–41)
Albumin: 2.8 g/dL — ABNORMAL LOW (ref 3.5–5.0)
Alkaline Phosphatase: 153 U/L — ABNORMAL HIGH (ref 38–126)
Anion gap: 13 (ref 5–15)
BUN: 7 mg/dL (ref 6–20)
CO2: 21 mmol/L — ABNORMAL LOW (ref 22–32)
Calcium: 9.4 mg/dL (ref 8.9–10.3)
Chloride: 102 mmol/L (ref 98–111)
Creatinine, Ser: 0.74 mg/dL (ref 0.44–1.00)
GFR calc non Af Amer: >60 mL/min (ref >60)
Glucose, Bld: 118 mg/dL — ABNORMAL HIGH (ref 70–99)
Potassium: 3.7 mmol/L (ref 3.5–5.1)
Sodium: 136 mmol/L (ref 135–145)
Total Bilirubin: 0.2 mg/dL — ABNORMAL LOW (ref 0.3–1.2)
Total Protein: 6.2 g/dL — ABNORMAL LOW (ref 6.5–8.1)

## 2020-01-11 LAB — PROTEIN / CREATININE RATIO, URINE
Creatinine, Urine: 274.23 mg/dL
Protein Creatinine Ratio: 1.43 mg/mg{Cre} — ABNORMAL HIGH (ref 0.00–0.15)
Total Protein, Urine: 391 mg/dL

## 2020-01-11 MED ORDER — ALUM & MAG HYDROXIDE-SIMETH 200-200-20 MG/5ML PO SUSP
30.0000 mL | Freq: Once | ORAL | Status: DC
  Filled 2020-01-11: qty 30

## 2020-01-11 MED ORDER — BETAMETHASONE SOD PHOS & ACET 6 (3-3) MG/ML IJ SUSP
12.0000 mg | Freq: Once | INTRAMUSCULAR | Status: AC
  Administered 2020-01-11: 12 mg via INTRAMUSCULAR
  Filled 2020-01-11: qty 5

## 2020-01-11 MED ORDER — LACTATED RINGERS IV SOLN: Freq: Once | INTRAVENOUS | Status: AC

## 2020-01-11 MED ORDER — LACTATED RINGERS IV SOLN: Freq: Once | INTRAVENOUS | Status: DC

## 2020-01-11 MED ORDER — PROMETHAZINE HCL 25 MG/ML IJ SOLN
12.5000 mg | Freq: Once | INTRAMUSCULAR | Status: AC
  Administered 2020-01-11: 12.5 mg via INTRAVENOUS
  Filled 2020-01-11: qty 1

## 2020-01-11 MED ORDER — LIDOCAINE VISCOUS HCL 2 % MT SOLN
15.0000 mL | Freq: Once | OROMUCOSAL | Status: DC
  Filled 2020-01-11: qty 15

## 2020-01-11 NOTE — MAU Provider Note (Addendum)
Chief Complaint:  Emesis   First Provider Initiated Contact with Patient 01/11/20 0536     HPI: Joann Jones is a 34 y.o. G2P1001 at 18w4dwho presents to maternity admissions reporting nausea, dizziness, headache and diarrhea.  Diarrhea started several weeks ago.  Nausea started yesterday but got worse tonight and started with dizziness.  Seen yesterday for r/o preeclampsia. Protein creat ratio is elevated. . She reports good fetal movement, denies LOF, vaginal bleeding, vaginal itching/burning, urinary symptoms, h/a, dizziness, diarrhea, constipation or fever/chills.  She denies visual changes or RUQ abdominal pain.  Has been followed for elevated Pr/Cr Ratio thought to be related to Lupus, SVT, ITP and Chronic hypertension.  Emesis  This is a new problem. The current episode started today. The problem occurs more than 10 times per day. The problem has been unchanged. There has been no fever. Associated symptoms include diarrhea, dizziness and headaches. Pertinent negatives include no chills, coughing, fever, myalgias or URI. She has tried nothing for the symptoms.    RN Note: Joann Jones is a 34 y.o. at [redacted]w[redacted]d here in MAU reporting: vomiting since 0200 this morning. She has thrown up 8x and also reports diarrhea. Was seen in MAU last night for PIH workup. No VB or LOF. +FM.   Past Medical History: Past Medical History:  Diagnosis Date  . Anemia   . Anxiety   . Asthma    with exertion  . Collagen vascular disease (HCC)   . Complication of anesthesia    hard to wake up  . Dyspnea   . Dysrhythmia    tachycardia  . GERD (gastroesophageal reflux disease)   . Hypertension   . IBS (irritable bowel syndrome)   . ITP (idiopathic thrombocytopenic purpura)   . Lupus (HCC)   . Migraines   . Palpitations   . Pelvic kidney   . Sleep apnea     Past obstetric history: OB History  Gravida Para Term Preterm AB Living  2 1 1  0   1  SAB TAB Ectopic Multiple Live Births          1     # Outcome Date GA Lbr Len/2nd Weight Sex Delivery Anes PTL Lv  2 Current           1 Term 01/06/15 [redacted]w[redacted]d   F Vag-Spont   LIV    Past Surgical History: Past Surgical History:  Procedure Laterality Date  . COLONOSCOPY WITH PROPOFOL N/A 07/18/2016   Procedure: COLONOSCOPY WITH PROPOFOL;  Surgeon: 07/20/2016, MD;  Location: [facility] ENDOSCOPY;  Service: Endoscopy;  Laterality: N/A;  . ESOPHAGOGASTRODUODENOSCOPY (EGD) WITH PROPOFOL N/A 07/18/2016   Procedure: ESOPHAGOGASTRODUODENOSCOPY (EGD) WITH PROPOFOL;  Surgeon: 07/20/2016, MD;  Location: [facility] ENDOSCOPY;  Service: Endoscopy;  Laterality: N/A;  . HERNIA REPAIR    . WRIST SURGERY Right     Family History: Family History  Problem Relation Age of Onset  . Hypertension Mother   . Hyperlipidemia Mother   . Hypertension Father   . Hyperlipidemia Father     Social History: Social History   Tobacco Use  . Smoking status: Former Smoker    Quit date: 10/21/2005    Years since quitting: 14.2  . Smokeless tobacco: Never Used  Vaping Use  . Vaping Use: Never used  Substance Use Topics  . Alcohol use: Not Currently    Comment: socially  . Drug use: No    Allergies:  Allergies  Allergen Reactions  . Codeine   .  Septra [Sulfamethoxazole-Trimethoprim] Other (See Comments)    Decrease platlets  . Tegaderm Ag Mesh [Silver] Itching    Meds:  Medications Prior to Admission  Medication Sig Dispense Refill Last Dose  . amLODipine (NORVASC) 2.5 MG tablet Take 2.5 mg by mouth daily.     Marland Kitchen aspirin EC 81 MG tablet Take 81 mg by mouth daily.     . Doxylamine-Pyridoxine (DICLEGIS PO) Take by mouth.     . ferrous sulfate 325 (65 FE) MG EC tablet Take 1 tablet (325 mg total) by mouth every other day. 30 tablet 2   . hydroxychloroquine (PLAQUENIL) 200 MG tablet Take by mouth daily.     Marland Kitchen omeprazole (PRILOSEC) 40 MG capsule Take by mouth.     . Prenatal Vit-Fe Fumarate-FA (PRENATAL VITAMINS PO) Take by mouth.     Marland Kitchen PROGESTERONE VA  Place vaginally.      . sertraline (ZOLOFT) 100 MG tablet Take by mouth at bedtime.        I have reviewed patient's Past Medical Hx, Surgical Hx, Family Hx, Social Hx, medications and allergies.   ROS:  Review of Systems  Constitutional: Negative for chills and fever.  Respiratory: Negative for cough.   Gastrointestinal: Positive for diarrhea and vomiting.  Musculoskeletal: Negative for myalgias.  Neurological: Positive for dizziness and headaches.   Other systems negative  Physical Exam   Patient Vitals for the past 24 hrs:  BP Temp Temp src Pulse Resp SpO2  01/11/20 0535 124/83 98 F (36.7 C) Oral 97 16 100 %   Constitutional: Well-developed, well-nourished female in no acute distress.  Cardiovascular: normal rate and rhythm Respiratory: normal effort, clear to auscultation bilaterally GI: Abd soft, non-tender, gravid appropriate for gestational age.   No rebound or guarding. MS: Extremities nontender, no edema, normal ROM Neurologic: Alert and oriented x 4.  GU: Neg CVAT.  PELVIC EXAM: deferred  FHT:  Baseline 130 , moderate variability, small accelerations present, no decelerations Contractions: Uterine irritability   Labs: Results for orders placed or performed during the hospital encounter of 01/11/20 (from the past 24 hour(s))  CBC     Status: Abnormal   Collection Time: 01/11/20  5:26 AM  Result Value Ref Range   WBC 8.2 4.0 - 10.5 K/uL   RBC 4.16 3.87 - 5.11 MIL/uL   Hemoglobin 12.7 12.0 - 15.0 g/dL   HCT 63.8 36 - 46 %   MCV 93.0 80.0 - 100.0 fL   MCH 30.5 26.0 - 34.0 pg   MCHC 32.8 30.0 - 36.0 g/dL   RDW 46.6 59.9 - 35.7 %   Platelets 126 (L) 150 - 400 K/uL   nRBC 0.0 0.0 - 0.2 %  Comprehensive metabolic panel     Status: Abnormal   Collection Time: 01/11/20  5:26 AM  Result Value Ref Range   Sodium 136 135 - 145 mmol/L   Potassium 3.7 3.5 - 5.1 mmol/L   Chloride 102 98 - 111 mmol/L   CO2 21 (L) 22 - 32 mmol/L   Glucose, Bld 118 (H) 70 - 99 mg/dL    BUN 7 6 - 20 mg/dL   Creatinine, Ser 0.17 0.44 - 1.00 mg/dL   Calcium 9.4 8.9 - 79.3 mg/dL   Total Protein 6.2 (L) 6.5 - 8.1 g/dL   Albumin 2.8 (L) 3.5 - 5.0 g/dL   AST 30 15 - 41 U/L   ALT 21 0 - 44 U/L   Alkaline Phosphatase 153 (H) 38 - 126 U/L  Total Bilirubin 0.2 (L) 0.3 - 1.2 mg/dL   GFR calc non Af Amer >60 >60 mL/min   Anion gap 13 5 - 15  Protein / creatinine ratio, urine     Status: Abnormal   Collection Time: 01/11/20  6:08 AM  Result Value Ref Range   Creatinine, Urine 274.23 mg/dL   Total Protein, Urine 391 mg/dL   Protein Creatinine Ratio 1.43 (H) 0.00 - 0.15 mg/mg[Cre]       Imaging:    MAU Course/MDM: I have ordered labs and reviewed results. These are normal except mild thrombocytopenia and elevated PCR NST reviewed, initially not reactive by strict criteria, now more reactive Dr Reina Fuse updated with presentation, exam findings and test results (has been following her)  Treatments in MAU included IV hydration, Phenergan Feeling better after first liter.   Headache much improved with hydration.   Still gets nauseated if she moves much Will give second liter.   Will give second dose of Betamethasone here (IOL tomorrow) Will get Covid test done for tomorrow  Assessment: Single IUP at [redacted]w[redacted]d Headache Dizziness, question vertigo No evidence of preeclampsia Elevated PCR felt to be due to Lupus Multiple medical problems not addressed tonight (SLE, ITP, Chronic HTN)  Plan: Care turned over to [facility] CNM    Wynelle Bourgeois CNM, MSN Certified Nurse-Midwife 01/11/2020 5:36 AM   Report received from M. Mayford Knife, CNM at [phone]. Care assumed at that time.  MDM Reita Cliche) --RN asked to limit fluid bolus to 1500 mL rather than 2L ordered by previous provider --HA resolved, patient sleeping throughout day shift --Concern for spontaneous decel at 0846. Cat I for one hour following.  --Tracing reviewed with both Dr. Donavan Foil and Dr. Senaida Ores, admission not  indicated  A/P: --34 y.o. G2P1001 at [redacted]w[redacted]d  --Reactive tracing s/p fluid bolus --CHTN, elevated P:Cr r/t Lupus --Headache resolved with treatments in administered in MAU --BMZ 2 of 2 given in MAU --Discharge home in stable condition  F/U: --Patient scheduled for IOL tomorrow morning 01/12/2020  Clayton Bibles, MSN, CNM Certified Nurse Midwife, [facility] for [employer], [facility] 01/11/20 10:32 AM

## 2020-01-11 NOTE — Discharge Instructions (Signed)

## 2020-01-11 NOTE — MAU Note (Signed)
.   Joann Jones is a 34 y.o. at [redacted]w[redacted]d here in MAU reporting: vomiting since 0200 this morning. She has thrown up 8x and also reports diarrhea. Was seen in MAU last night for PIH workup. No VB or LOF. +FM.   Pain score: 4 Vitals:   01/11/20 0535  BP: 124/83  Pulse: 97  Resp: 16  Temp: 98 F (36.7 C)  SpO2: 100%     FHT:135

## 2020-01-11 NOTE — H&P (Signed)
Joann Jones is a 34 y.o. female G2P1001 at 36+ for IOL given multiple medical problems - including IUGR, increased proteinuria.  Pt s/p BMZ x 2.  Has had nausea and dizziness.  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  D/W pt r/b/a of IOL and process.  Will perform with cytotec , AROM and pitocin.  EDC 02/04/20 by early 02/06/20.  Recived BMZ 10/5 and 10/6.  Received Tdap 8/5  OB History    Gravida  2   Para  1   Term  1   Preterm  0   AB      Living  1     SAB      TAB      Ectopic      Multiple      Live Births  1         G1 4#12 female, 37wk SVD G2 present  Last pap 1/21 WNL, + h/o abn pap - repeat WNL H/o trich  Past Medical History:  Diagnosis Date  . Anemia   . Anxiety   . Asthma    with exertion  . Collagen vascular disease (HCC)   . Complication of anesthesia    hard to wake up  . Dyspnea   . Dysrhythmia    tachycardia  . GERD (gastroesophageal reflux disease)   . Hypertension   . IBS (irritable bowel syndrome)   . ITP (idiopathic thrombocytopenic purpura)   . Lupus (HCC)   . Migraines   . Palpitations   . Pelvic kidney   . Sleep apnea    Past Surgical History:  Procedure Laterality Date  . COLONOSCOPY WITH PROPOFOL N/A 07/18/2016   Procedure: COLONOSCOPY WITH PROPOFOL;  Surgeon: 07/20/2016, MD;  Location: [facility] ENDOSCOPY;  Service: Endoscopy;  Laterality: N/A;  . ESOPHAGOGASTRODUODENOSCOPY (EGD) WITH PROPOFOL N/A 07/18/2016   Procedure: ESOPHAGOGASTRODUODENOSCOPY (EGD) WITH PROPOFOL;  Surgeon: 07/20/2016, MD;  Location: [facility] ENDOSCOPY;  Service: Endoscopy;  Laterality: N/A;  . HERNIA REPAIR    . WRIST SURGERY Right    Family History: family history  includes Hyperlipidemia in her father and mother; Hypertension in her father and mother.ovarian cancer, DM, cervical cancer, MI Social History:  reports that she quit smoking about 14 years ago. She has never used smokeless tobacco. She reports previous alcohol use. She reports that she does not use drugs. med lab tech, married  Meds amlodipine, ASA, colace, Diclegis, iron, folic acid, hydroxychloroquine, prilosec, PNV, setraline     Maternal Diabetes: No Genetic Screening: Normal Maternal Ultrasounds/Referrals: IUGR and Other:Blake's Pouch cust Fetal Ultrasounds or other Referrals:  Referred to Materal Fetal Medicine  Maternal Substance Abuse:  No Significant Maternal Medications:  Meds include: Other: amlodipine, hydroxychloroquine, Zoloft.  Received BMZ Significant Maternal Lab Results:  Group B Strep negative Other Comments:  Lupus, CHTN treated w amlodipine, thrombocytopenia, anxiety - on zoloft, SVT, OSA, anemia, blake pouch cyst - mfm eval - nl MRI  Review of Systems  Constitutional: Negative.   HENT: Negative.   Eyes: Negative.   Respiratory: Negative.   Cardiovascular: Negative.   Gastrointestinal: Negative.   Genitourinary: Negative.   Musculoskeletal: Positive for back pain.  Skin: Negative.   Neurological:  Negative.   Psychiatric/Behavioral: Negative.    Maternal Medical History:  Contractions: Frequency: irregular.    Fetal activity: Perceived fetal activity is normal.    Prenatal complications: IUGR and thrombocytopenia.   IUGR 2% - growth followed, q 4wk, BPP q week Lupus on hydroxychloroquine, increased UPC CHTN on amlodipine Thrombocytopenia Anxiety SVT Shortened cervical length 2.6cm (stable) Anemia - has received IV iron Blake Pouch cyst - MFM eval, nl MRI   Prenatal Complications - Diabetes: none.      There were no vitals taken for this visit. Maternal Exam:  Uterine Assessment: Contraction frequency is irregular.   Abdomen: Patient reports  no abdominal tenderness. Fundal height is IUGR by [place].   Fetal presentation: vertex  Introitus: Normal vulva. Normal vagina.    Physical Exam Constitutional:      Appearance: Normal appearance.  HENT:     Head: Normocephalic and atraumatic.  Cardiovascular:     Rate and Rhythm: Normal rate and regular rhythm.  Pulmonary:     Effort: Pulmonary effort is normal.     Breath sounds: Normal breath sounds.  Abdominal:     General: Bowel sounds are normal.     Palpations: Abdomen is soft.     Comments: GRAVID  Genitourinary:    General: Normal vulva.  Musculoskeletal:        General: Normal range of motion.     Cervical back: Normal range of motion and neck supple.  Skin:    General: Skin is warm and dry.  Neurological:     General: No focal deficit present.     Mental Status: She is alert and oriented to person, place, and time.  Psychiatric:        Mood and Affect: Mood normal.        Behavior: Behavior normal.     Prenatal labs: ABO, Rh:  A+ Antibody:  neg Rubella:  immune RPR:   NR HBsAg:   neg HIV:   neg GBS:   neg  Hgb 11.0/Plt 162-130-117-126/Ur cx neg/CHl neg/GC neg/Varicella immune/essential panel neg/HCV neg/Hgb electro WNL/glucola 92/  Dated by [place] at 6 wk Tdap 8/5  US reveals IUGR, nl doppplers, nl fluid  Assessment/Plan: 34yo G2P1001 at 36+ with IUGR, inc UPC and multiple other medical problems Stadol v epidural for pain IOL w cytotec and pitocin and ROM CLOSE MOITORING Expect SVD  Antha Niday Bovard-Stuckert 01/11/2020, 9:50 PM

## 2020-01-12 ENCOUNTER — Inpatient Hospital Stay (HOSPITAL_COMMUNITY): Payer: BC Managed Care – PPO | Admitting: Anesthesiology

## 2020-01-12 ENCOUNTER — Other Ambulatory Visit: Payer: Self-pay

## 2020-01-12 ENCOUNTER — Inpatient Hospital Stay (HOSPITAL_COMMUNITY): Payer: BC Managed Care – PPO

## 2020-01-12 ENCOUNTER — Inpatient Hospital Stay (HOSPITAL_COMMUNITY)
Admission: AD | Admit: 2020-01-12 | Discharge: 2020-01-14 | DRG: 797 | Disposition: A | Discharge status: Home / Self Care | Payer: BC Managed Care – PPO | Attending: Obstetrics and Gynecology | Admitting: Obstetrics and Gynecology

## 2020-01-12 ENCOUNTER — Encounter (HOSPITAL_COMMUNITY): Payer: Self-pay | Admitting: Obstetrics and Gynecology

## 2020-01-12 DIAGNOSIS — Z7982 Long term (current) use of aspirin: Secondary | ICD-10-CM

## 2020-01-12 DIAGNOSIS — O9912 Other diseases of the blood and blood-forming organs and certain disorders involving the immune mechanism complicating childbirth: Secondary | ICD-10-CM | POA: Diagnosis present

## 2020-01-12 DIAGNOSIS — O99891 Other specified diseases and conditions complicating pregnancy: Secondary | ICD-10-CM | POA: Diagnosis present

## 2020-01-12 DIAGNOSIS — Z3A36 36 weeks gestation of pregnancy: Secondary | ICD-10-CM | POA: Diagnosis not present

## 2020-01-12 DIAGNOSIS — O1002 Pre-existing essential hypertension complicating childbirth: Secondary | ICD-10-CM | POA: Diagnosis present

## 2020-01-12 DIAGNOSIS — Z20822 Contact with and (suspected) exposure to covid-19: Secondary | ICD-10-CM | POA: Diagnosis present

## 2020-01-12 DIAGNOSIS — O365930 Maternal care for other known or suspected poor fetal growth, third trimester, not applicable or unspecified: Principal | ICD-10-CM | POA: Diagnosis present

## 2020-01-12 DIAGNOSIS — O99344 Other mental disorders complicating childbirth: Secondary | ICD-10-CM | POA: Diagnosis present

## 2020-01-12 DIAGNOSIS — Z87891 Personal history of nicotine dependence: Secondary | ICD-10-CM

## 2020-01-12 DIAGNOSIS — O9962 Diseases of the digestive system complicating childbirth: Secondary | ICD-10-CM | POA: Diagnosis present

## 2020-01-12 DIAGNOSIS — M329 Systemic lupus erythematosus, unspecified: Secondary | ICD-10-CM | POA: Diagnosis present

## 2020-01-12 DIAGNOSIS — F419 Anxiety disorder, unspecified: Secondary | ICD-10-CM | POA: Diagnosis present

## 2020-01-12 DIAGNOSIS — O365990 Maternal care for other known or suspected poor fetal growth, unspecified trimester, not applicable or unspecified: Secondary | ICD-10-CM | POA: Diagnosis present

## 2020-01-12 DIAGNOSIS — K219 Gastro-esophageal reflux disease without esophagitis: Secondary | ICD-10-CM | POA: Diagnosis present

## 2020-01-12 DIAGNOSIS — D693 Immune thrombocytopenic purpura: Secondary | ICD-10-CM | POA: Diagnosis present

## 2020-01-12 LAB — CBC
HCT: 35.3 % — ABNORMAL LOW (ref 36.0–46.0)
HCT: 35.9 % — ABNORMAL LOW (ref 36.0–46.0)
Hemoglobin: 11.3 g/dL — ABNORMAL LOW (ref 12.0–15.0)
Hemoglobin: 11.3 g/dL — ABNORMAL LOW (ref 12.0–15.0)
MCH: 30.4 pg (ref 26.0–34.0)
MCH: 30 pg (ref 26.0–34.0)
MCHC: 32 g/dL (ref 30.0–36.0)
MCHC: 31.5 g/dL (ref 30.0–36.0)
MCV: 94.9 fL (ref 80.0–100.0)
MCV: 95.2 fL (ref 80.0–100.0)
Platelets: 127 10*3/uL — ABNORMAL LOW (ref 150–400)
Platelets: 121 10*3/uL — ABNORMAL LOW (ref 150–400)
RBC: 3.72 MIL/uL — ABNORMAL LOW (ref 3.87–5.11)
RBC: 3.77 MIL/uL — ABNORMAL LOW (ref 3.87–5.11)
RDW: 15.2 % (ref 11.5–15.5)
RDW: 15.2 % (ref 11.5–15.5)
WBC: 11.1 10*3/uL — ABNORMAL HIGH (ref 4.0–10.5)
WBC: 9.6 10*3/uL (ref 4.0–10.5)
nRBC: 0 % (ref 0.0–0.2)
nRBC: 0 % (ref 0.0–0.2)

## 2020-01-12 LAB — COMPREHENSIVE METABOLIC PANEL
ALT: 21 U/L (ref 0–44)
AST: 27 U/L (ref 15–41)
Albumin: 2.5 g/dL — ABNORMAL LOW (ref 3.5–5.0)
Alkaline Phosphatase: 134 U/L — ABNORMAL HIGH (ref 38–126)
Anion gap: 9 (ref 5–15)
BUN: <5 mg/dL — ABNORMAL LOW (ref 6–20)
CO2: 24 mmol/L (ref 22–32)
Calcium: 8.7 mg/dL — ABNORMAL LOW (ref 8.9–10.3)
Chloride: 106 mmol/L (ref 98–111)
Creatinine, Ser: 0.66 mg/dL (ref 0.44–1.00)
GFR calc non Af Amer: >60 mL/min (ref >60)
Glucose, Bld: 111 mg/dL — ABNORMAL HIGH (ref 70–99)
Potassium: 3.4 mmol/L — ABNORMAL LOW (ref 3.5–5.1)
Sodium: 139 mmol/L (ref 135–145)
Total Bilirubin: 0.2 mg/dL — ABNORMAL LOW (ref 0.3–1.2)
Total Protein: 5.6 g/dL — ABNORMAL LOW (ref 6.5–8.1)

## 2020-01-12 LAB — TYPE AND SCREEN
ABO/RH(D): A POS
Antibody Screen: NEGATIVE

## 2020-01-12 LAB — RPR: RPR Ser Ql: NONREACTIVE

## 2020-01-12 LAB — PROTEIN / CREATININE RATIO, URINE
Creatinine, Urine: 100.58 mg/dL
Protein Creatinine Ratio: 0.66 mg/mg{Cre} — ABNORMAL HIGH (ref 0.00–0.15)
Total Protein, Urine: 66 mg/dL

## 2020-01-12 MED ORDER — TETANUS-DIPHTH-ACELL PERTUSSIS 5-2.5-18.5 LF-MCG/0.5 IM SUSP: 0.5000 mL | Freq: Once | INTRAMUSCULAR | Status: DC

## 2020-01-12 MED ORDER — FENTANYL-BUPIVACAINE-NACL 0.5-0.125-0.9 MG/250ML-% EP SOLN
12.0000 mL/h | EPIDURAL | Status: DC | PRN
  Filled 2020-01-12: qty 250

## 2020-01-12 MED ORDER — EPHEDRINE 5 MG/ML INJ: 10.0000 mg | INTRAVENOUS | Status: DC | PRN

## 2020-01-12 MED ORDER — LACTATED RINGERS IV SOLN
500.0000 mL | INTRAVENOUS | Status: DC | PRN
  Administered 2020-01-12: 300 mL via INTRAVENOUS

## 2020-01-12 MED ORDER — COCONUT OIL OIL
1.0000 "application " | TOPICAL_OIL | Status: DC | PRN
  Administered 2020-01-14: 1 via TOPICAL

## 2020-01-12 MED ORDER — LACTATED RINGERS IV SOLN
500.0000 mL | Freq: Once | INTRAVENOUS | Status: AC
  Administered 2020-01-12: 500 mL via INTRAVENOUS

## 2020-01-12 MED ORDER — LIDOCAINE HCL (PF) 1 % IJ SOLN: 30.0000 mL | INTRAMUSCULAR | Status: DC | PRN

## 2020-01-12 MED ORDER — DIPHENHYDRAMINE HCL 25 MG PO CAPS: 25.0000 mg | ORAL_CAPSULE | Freq: Four times a day (QID) | ORAL | Status: DC | PRN

## 2020-01-12 MED ORDER — ACETAMINOPHEN 325 MG PO TABS
650.0000 mg | ORAL_TABLET | ORAL | Status: DC | PRN
  Administered 2020-01-13: 650 mg via ORAL
  Filled 2020-01-12: qty 2

## 2020-01-12 MED ORDER — BENZOCAINE-MENTHOL 20-0.5 % EX AERO
1.0000 "application " | INHALATION_SPRAY | CUTANEOUS | Status: DC | PRN
  Filled 2020-01-12: qty 56

## 2020-01-12 MED ORDER — OXYTOCIN BOLUS FROM INFUSION: 333.0000 mL | Freq: Once | INTRAVENOUS | Status: DC

## 2020-01-12 MED ORDER — ONDANSETRON HCL 4 MG/2ML IJ SOLN: 4.0000 mg | Freq: Four times a day (QID) | INTRAMUSCULAR | Status: DC | PRN

## 2020-01-12 MED ORDER — SENNOSIDES-DOCUSATE SODIUM 8.6-50 MG PO TABS
2.0000 | ORAL_TABLET | ORAL | Status: DC
  Administered 2020-01-12: 2 via ORAL
  Filled 2020-01-12: qty 2

## 2020-01-12 MED ORDER — OXYCODONE-ACETAMINOPHEN 5-325 MG PO TABS: 2.0000 | ORAL_TABLET | ORAL | Status: DC | PRN

## 2020-01-12 MED ORDER — LIDOCAINE HCL (PF) 1 % IJ SOLN
INTRAMUSCULAR | Status: DC | PRN
  Administered 2020-01-12: 4 mL via EPIDURAL
  Administered 2020-01-12: 3 mL via EPIDURAL

## 2020-01-12 MED ORDER — IBUPROFEN 600 MG PO TABS
600.0000 mg | ORAL_TABLET | Freq: Four times a day (QID) | ORAL | Status: DC
  Administered 2020-01-12 – 2020-01-13 (×3): 600 mg via ORAL
  Filled 2020-01-12 (×4): qty 1

## 2020-01-12 MED ORDER — PRENATAL MULTIVITAMIN CH
1.0000 | ORAL_TABLET | Freq: Every day | ORAL | Status: DC
  Administered 2020-01-13: 1 via ORAL
  Filled 2020-01-12: qty 1

## 2020-01-12 MED ORDER — LACTATED RINGERS IV SOLN: INTRAVENOUS | Status: DC

## 2020-01-12 MED ORDER — PHENYLEPHRINE 40 MCG/ML (10ML) SYRINGE FOR IV PUSH (FOR BLOOD PRESSURE SUPPORT): 80.0000 ug | PREFILLED_SYRINGE | INTRAVENOUS | Status: DC | PRN

## 2020-01-12 MED ORDER — MISOPROSTOL 25 MCG QUARTER TABLET: 25.0000 ug | ORAL_TABLET | Freq: Three times a day (TID) | ORAL | Status: DC | PRN

## 2020-01-12 MED ORDER — MISOPROSTOL 50MCG HALF TABLET: 50.0000 ug | ORAL_TABLET | Freq: Three times a day (TID) | ORAL | Status: DC | PRN

## 2020-01-12 MED ORDER — PRENATAL VITAMINS 28-0.8 MG PO TABS: ORAL_TABLET | Freq: Every morning | ORAL | Status: DC

## 2020-01-12 MED ORDER — PANTOPRAZOLE SODIUM 40 MG PO TBEC: 40.0000 mg | DELAYED_RELEASE_TABLET | Freq: Every day | ORAL | Status: DC

## 2020-01-12 MED ORDER — PNEUMOCOCCAL VAC POLYVALENT 25 MCG/0.5ML IJ INJ: 0.5000 mL | INJECTION | INTRAMUSCULAR | Status: DC

## 2020-01-12 MED ORDER — ONDANSETRON HCL 4 MG/2ML IJ SOLN: 4.0000 mg | INTRAMUSCULAR | Status: DC | PRN

## 2020-01-12 MED ORDER — OXYTOCIN-SODIUM CHLORIDE 30-0.9 UT/500ML-% IV SOLN: 2.5000 [IU]/h | INTRAVENOUS | Status: DC

## 2020-01-12 MED ORDER — HYDROXYCHLOROQUINE SULFATE 200 MG PO TABS
400.0000 mg | ORAL_TABLET | Freq: Every day | ORAL | Status: DC
  Filled 2020-01-12 (×2): qty 2

## 2020-01-12 MED ORDER — OXYCODONE HCL 5 MG PO TABS: 10.0000 mg | ORAL_TABLET | ORAL | Status: DC | PRN

## 2020-01-12 MED ORDER — FERROUS SULFATE 325 (65 FE) MG PO TABS: 325.0000 mg | ORAL_TABLET | ORAL | Status: DC

## 2020-01-12 MED ORDER — SODIUM CHLORIDE 0.9 % IV SOLN
2.0000 g | Freq: Two times a day (BID) | INTRAVENOUS | Status: AC
  Administered 2020-01-12: 2 g via INTRAVENOUS
  Filled 2020-01-12 (×3): qty 2

## 2020-01-12 MED ORDER — SOD CITRATE-CITRIC ACID 500-334 MG/5ML PO SOLN: 30.0000 mL | ORAL | Status: DC | PRN

## 2020-01-12 MED ORDER — OXYCODONE HCL 5 MG PO TABS: 5.0000 mg | ORAL_TABLET | ORAL | Status: DC | PRN

## 2020-01-12 MED ORDER — AMLODIPINE BESYLATE 5 MG PO TABS: 2.5000 mg | ORAL_TABLET | Freq: Every day | ORAL | Status: DC

## 2020-01-12 MED ORDER — SODIUM CHLORIDE (PF) 0.9 % IJ SOLN
INTRAMUSCULAR | Status: DC | PRN
Start: 2020-01-12 — End: 2020-01-12
  Administered 2020-01-12: 11 mL/h via EPIDURAL

## 2020-01-12 MED ORDER — ACETAMINOPHEN 325 MG PO TABS
650.0000 mg | ORAL_TABLET | ORAL | Status: DC | PRN
  Administered 2020-01-12: 650 mg via ORAL
  Filled 2020-01-12: qty 2

## 2020-01-12 MED ORDER — SERTRALINE HCL 100 MG PO TABS
100.0000 mg | ORAL_TABLET | Freq: Every day | ORAL | Status: DC
  Filled 2020-01-12 (×2): qty 1

## 2020-01-12 MED ORDER — TERBUTALINE SULFATE 1 MG/ML IJ SOLN: 0.2500 mg | Freq: Once | INTRAMUSCULAR | Status: DC | PRN

## 2020-01-12 MED ORDER — SIMETHICONE 80 MG PO CHEW: 80.0000 mg | CHEWABLE_TABLET | ORAL | Status: DC | PRN

## 2020-01-12 MED ORDER — WITCH HAZEL-GLYCERIN EX PADS: 1.0000 "application " | MEDICATED_PAD | CUTANEOUS | Status: DC | PRN

## 2020-01-12 MED ORDER — ZOLPIDEM TARTRATE 5 MG PO TABS: 5.0000 mg | ORAL_TABLET | Freq: Every evening | ORAL | Status: DC | PRN

## 2020-01-12 MED ORDER — BUTORPHANOL TARTRATE 1 MG/ML IJ SOLN: 1.0000 mg | INTRAMUSCULAR | Status: DC | PRN

## 2020-01-12 MED ORDER — OXYTOCIN-SODIUM CHLORIDE 30-0.9 UT/500ML-% IV SOLN
1.0000 m[IU]/min | INTRAVENOUS | Status: DC
  Administered 2020-01-12: 2 m[IU]/min via INTRAVENOUS
  Filled 2020-01-12: qty 500

## 2020-01-12 MED ORDER — DIBUCAINE (PERIANAL) 1 % EX OINT: 1.0000 "application " | TOPICAL_OINTMENT | CUTANEOUS | Status: DC | PRN

## 2020-01-12 MED ORDER — OXYCODONE-ACETAMINOPHEN 5-325 MG PO TABS: 1.0000 | ORAL_TABLET | ORAL | Status: DC | PRN

## 2020-01-12 MED ORDER — DIPHENHYDRAMINE HCL 50 MG/ML IJ SOLN: 12.5000 mg | INTRAMUSCULAR | Status: DC | PRN

## 2020-01-12 MED ORDER — ONDANSETRON HCL 4 MG PO TABS: 4.0000 mg | ORAL_TABLET | ORAL | Status: DC | PRN

## 2020-01-12 NOTE — Progress Notes (Signed)
Patient ID: Joann Jones, female   DOB: [birth year], 34 y.o.   MRN: [id number]  Comfortable with epidural, occasional pressure  AFVSS gen NAD FHTs mod var, has had early/variable decels, some prolonged; mod var; category 2, improved w position change toco Q 2-34min  Will restart pitocin Recheck in 30 min.  If unchanged w proceed w LTCS fo persistent category 2 tracing, or with increasing decels.

## 2020-01-12 NOTE — H&P (Signed)
Joann Jones is a 34 y.o. female G2P1001 at 36+ for IOL given multiple medical problems - including IUGR, increased proteinuria.  Pt s/p BMZ x 2.  Has had nausea and dizziness.  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  D/W pt r/b/a of IOL and process.  Will perform with cytotec , AROM and pitocin.  EDC 02/04/20 by early 02/06/20.  Recived BMZ 10/5 and 10/6.  Received Tdap 8/5  OB History    Gravida  2   Para  1   Term  1   Preterm  0   AB      Living  1     SAB      TAB      Ectopic      Multiple      Live Births  1         G1 4#12 female, 37wk SVD G2 present  Last pap 1/21 WNL, + h/o abn pap - repeat WNL H/o trich  Past Medical History:  Diagnosis Date  . Anemia   . Anxiety   . Asthma    with exertion  . Collagen vascular disease (HCC)   . Complication of anesthesia    hard to wake up  . Dyspnea   . Dysrhythmia    tachycardia  . GERD (gastroesophageal reflux disease)   . Hypertension   . IBS (irritable bowel syndrome)   . ITP (idiopathic thrombocytopenic purpura)   . Lupus (HCC)   . Migraines   . Palpitations   . Pelvic kidney   . Sleep apnea    Past Surgical History:  Procedure Laterality Date  . COLONOSCOPY WITH PROPOFOL N/A 07/18/2016   Procedure: COLONOSCOPY WITH PROPOFOL;  Surgeon: 07/20/2016, MD;  Location[facility] ENDOSCOPY;  Service: Endoscopy;  Laterality: N/A;  . ESOPHAGOGASTRODUODENOSCOPY (EGD) WITH PROPOFOL N/A 07/18/2016   Procedure: ESOPHAGOGASTRODUODENOSCOPY (EGD) WITH PROPOFOL;  Surgeon: 07/20/2016, MD;  Location: [facility] ENDOSCOPY;  Service: Endoscopy;  Laterality: N/A;  . HERNIA REPAIR    . WRIST SURGERY Right    Family History: family history  includes Hyperlipidemia in her father and mother; Hypertension in her father and mother.ovarian cancer, DM, cervical cancer, MI Social History:  reports that she quit smoking about 14 years ago. She has never used smokeless tobacco. She reports previous alcohol use. She reports that she does not use drugs. med lab tech, married  Meds amlodipine, ASA, colace, Diclegis, iron, folic acid, hydroxychloroquine, prilosec, PNV, setraline     Maternal Diabetes: No Genetic Screening: Normal Maternal Ultrasounds/Referrals: IUGR and Other:Blake's Pouch cust Fetal Ultrasounds or other Referrals:  Referred to Materal Fetal Medicine  Maternal Substance Abuse:  No Significant Maternal Medications:  Meds include: Other: amlodipine, hydroxychloroquine, Zoloft.  Received BMZ Significant Maternal Lab Results:  Group B Strep negative Other Comments:  Lupus, CHTN treated w amlodipine, thrombocytopenia, anxiety - on zoloft, SVT, OSA, anemia, blake pouch cyst - mfm eval - nl MRI  Review of Systems  Constitutional: Negative.   HENT: Negative.   Eyes: Negative.   Respiratory: Negative.   Cardiovascular: Negative.   Gastrointestinal: Negative.   Genitourinary: Negative.   Musculoskeletal: Positive for back pain.  Skin: Negative.   Neurological:  Negative.   Psychiatric/Behavioral: Negative.    Maternal Medical History:  Contractions: Frequency: irregular.    Fetal activity: Perceived fetal activity is normal.    Prenatal complications: IUGR and thrombocytopenia.   IUGR 2% - growth followed, q 4wk, BPP q week Lupus on hydroxychloroquine, increased UPC CHTN on amlodipine Thrombocytopenia Anxiety SVT Shortened cervical length 2.6cm (stable) Anemia - has received IV iron Blake Pouch cyst - MFM eval, nl MRI   Prenatal Complications - Diabetes: none.    Dilation: 4 Effacement (%): 80 Station: -1 Exam by:: Joann Leavens, RN Blood pressure 124/77, pulse 82. Maternal Exam:  Uterine Assessment:  Contraction frequency is irregular.   Abdomen: Patient reports no abdominal tenderness. Fundal height is IUGR by [place].   Fetal presentation: vertex  Introitus: Normal vulva. Normal vagina.    Physical Exam Constitutional:      Appearance: Normal appearance.  HENT:     Head: Normocephalic and atraumatic.  Cardiovascular:     Rate and Rhythm: Normal rate and regular rhythm.  Pulmonary:     Effort: Pulmonary effort is normal.     Breath sounds: Normal breath sounds.  Abdominal:     General: Bowel sounds are normal.     Palpations: Abdomen is soft.     Comments: GRAVID  Genitourinary:    General: Normal vulva.  Musculoskeletal:        General: Normal range of motion.     Cervical back: Normal range of motion and neck supple.  Skin:    General: Skin is warm and dry.  Neurological:     General: No focal deficit present.     Mental Status: She is alert and oriented to person, place, and time.  Psychiatric:        Mood and Affect: Mood normal.        Behavior: Behavior normal.     Prenatal labs: ABO, Rh: --/--/PENDING (10/07 0823)A+ Antibody: PENDING (10/07 0823)neg Rubella:  immune RPR:   NR HBsAg:   neg HIV:   neg GBS:   neg  Hgb 11.0/Plt 162-130-117-126/Ur cx neg/CHl neg/GC neg/Varicella immune/essential panel neg/HCV neg/Hgb electro WNL/glucola 92/  Dated by [place] at 6 wk Tdap 8/5  US reveals IUGR, nl doppplers, nl fluid  Assessment/Plan: 34yo G2P1001 at 36+ with IUGR, inc UPC and multiple other medical problems Stadol v epidural for pain IOL w cytotec and pitocin and ROM CLOSE MOITORING Expect SVD  [name] 01/12/2020, 9:05 AM

## 2020-01-12 NOTE — Consult Note (Signed)
Neonatology Note:   Attendance at Delivery:    I was asked by Dr. Bovard to attend this vaginal delivery at 36 5/7 weeks due to IUGR status. The mother is a G2P1, GBS neg with good prenatal care complicated by IOL for increased proteinuria, IUGR, maternal thrombocytopenia, SLE on hydroxychloroquine, CHTN on amlodipine, anxiety on Zoloft, SVT, and blake pouch cyst (MFM eval with normal MRI). ROM 5h 39m prior to delivery, fluid clear.  Loose nuchal.  Infant somewhat vigorous with fair spontaneous cry and tone.  Decided to cut cord early and bring to warmer.  Bulb suctioned and warm and dried.  Poor resp effort.  BBO2 initiated then switched to CPAP due to persistent poor resp effort and waning HR <100.  Sao2 in 60s; fio2 titrated accordingly.  PPV was required due to persistent poor resp effort; subsequently spont breathing improved but inc wob and obvious obstruction of nares.  Deep suctioned thru both nares removing moderate amount of very thick mucous thereby relieving obstruction.  Improved air entry and WOB, color and tone.  Ap 4/8. Lungs clear to ausc in DR on RA with stable vitals. Family updated.  Weight ~2kg.  Parents educated briefly on basic developmental areas (breathing, temperature regulation, eating and sugars) which will be monitored in MBU per routine to ensure appropriate transition.      Anuj Summons C. Jubal Rademaker, MD   

## 2020-01-12 NOTE — Progress Notes (Signed)
Patient ID: Joann Jones, female   DOB: 1986/03/28, 34 y.o.   MRN: [id number]  Comfortable with epidural.    AFVSS gen NAD FHTs 130's, mod var; after ROM prolonged decel to 60's for 5 min.  Next ctx decel noted - then mod var - will monitor closely.  Category 2.  Irregular variables before ROM.   toco q 3-4 min  AROM for clear fluid, w/o diff/comp IUPC placed FSE placed with prolonged decel  D/W pt r/b/a of LTCS.including bleeding, infection, damage to surrounding organs; trouble healing, injury to infant.    Continue IOL, pitocin now at 6 mU/min

## 2020-01-12 NOTE — Anesthesia Preprocedure Evaluation (Signed)
Anesthesia Evaluation  Patient identified by MRN, date of birth, ID band Patient awake    Reviewed: Allergy & Precautions, Patient's Chart, lab work & pertinent test results  History of Anesthesia Complications (+) history of anesthetic complications  Airway Mallampati: II  TM Distance: >3 FB Neck ROM: Full    Dental no notable dental hx. (+) Teeth Intact   Pulmonary shortness of breath and with exertion, asthma , sleep apnea , former smoker,    Pulmonary exam normal breath sounds clear to auscultation       Cardiovascular hypertension, Normal cardiovascular exam+ dysrhythmias  Rhythm:Regular Rate:Normal     Neuro/Psych  Headaches, Anxiety    GI/Hepatic GERD  Medicated and Controlled,  Endo/Other    Renal/GU Renal disease  negative genitourinary   Musculoskeletal Lupus   Abdominal (+) + obese,   Peds  Hematology  (+) anemia , ITP   Anesthesia Other Findings   Reproductive/Obstetrics Gestational HTN                             Anesthesia Physical Anesthesia Plan  ASA: III  Anesthesia Plan: Epidural   Post-op Pain Management:    Induction:   PONV Risk Score and Plan:   Airway Management Planned: Natural Airway  Additional Equipment:   Intra-op Plan:   Post-operative Plan:   Informed Consent: I have reviewed the patients History and Physical, chart, labs and discussed the procedure including the risks, benefits and alternatives for the proposed anesthesia with the patient or authorized representative who has indicated his/her understanding and acceptance.       Plan Discussed with: Anesthesiologist  Anesthesia Plan Comments:         Anesthesia Quick Evaluation

## 2020-01-12 NOTE — Progress Notes (Signed)
Patient ID: Joann Jones, female   DOB: 30-Aug-1985, 34 y.o.   MRN: [id number]  H&P reviewed, no changes  Pt desires epidural before AROM Will start pitocin at 56mU and increase by 58mU  D/w pt POC - IOL due to proteinuria and IUGR Poss need for LTCS and NICU admission  Plan for SVD.

## 2020-01-12 NOTE — Anesthesia Procedure Notes (Signed)
Epidural Patient location during procedure: OB Start time: 01/12/2020 10:03 AM End time: 01/12/2020 10:11 AM  Staffing Anesthesiologist: Mal Amabile, MD Performed: anesthesiologist   Preanesthetic Checklist Completed: patient identified, IV checked, site marked, risks and benefits discussed, surgical consent, monitors and equipment checked, pre-op evaluation and timeout performed  Epidural Patient position: sitting Prep: DuraPrep and site prepped and draped Patient monitoring: continuous pulse ox and blood pressure Approach: midline Location: L3-L4 Injection technique: LOR air  Needle:  Needle type: Tuohy  Needle gauge: 17 G Needle length: 9 cm and 9 Needle insertion depth: 4 cm Catheter type: closed end flexible Catheter size: 19 Gauge Catheter at skin depth: 9 cm Test dose: negative and Other  Assessment Events: blood not aspirated, injection not painful, no injection resistance, no paresthesia and negative IV test  Additional Notes Patient identified. Risks and benefits discussed including failed block, incomplete  Pain control, post dural puncture headache, nerve damage, paralysis, blood pressure Changes, nausea, vomiting, reactions to medications-both toxic and allergic and post Partum back pain. All questions were answered. Patient expressed understanding and wished to proceed. Sterile technique was used throughout procedure. Epidural site was Dressed with sterile barrier dressing. No paresthesias, signs of intravascular injection Or signs of intrathecal spread were encountered.  Patient was more comfortable after the epidural was dosed. Please see RN's note for documentation of vital signs and FHR which are stable. Reason for block:procedure for pain

## 2020-01-13 LAB — CBC
HCT: 33.2 % — ABNORMAL LOW (ref 36.0–46.0)
Hemoglobin: 10.8 g/dL — ABNORMAL LOW (ref 12.0–15.0)
MCH: 31.3 pg (ref 26.0–34.0)
MCHC: 32.5 g/dL (ref 30.0–36.0)
MCV: 96.2 fL (ref 80.0–100.0)
Platelets: 124 10*3/uL — ABNORMAL LOW (ref 150–400)
RBC: 3.45 MIL/uL — ABNORMAL LOW (ref 3.87–5.11)
RDW: 15.3 % (ref 11.5–15.5)
WBC: 11.4 10*3/uL — ABNORMAL HIGH (ref 4.0–10.5)
nRBC: 0 % (ref 0.0–0.2)

## 2020-01-13 MED ORDER — MECLIZINE HCL 25 MG PO TABS
25.0000 mg | ORAL_TABLET | Freq: Three times a day (TID) | ORAL | Status: DC | PRN
  Administered 2020-01-13: 25 mg via ORAL
  Filled 2020-01-13 (×2): qty 1

## 2020-01-13 MED ORDER — MECLIZINE HCL 25 MG PO TABS
25.0000 mg | ORAL_TABLET | Freq: Four times a day (QID) | ORAL | Status: DC | PRN
  Administered 2020-01-13 – 2020-01-14 (×2): 25 mg via ORAL
  Filled 2020-01-13 (×3): qty 1

## 2020-01-13 NOTE — Anesthesia Postprocedure Evaluation (Signed)
Anesthesia Post Note  Patient: Kirkland Hun Gamblin  Procedure(s) Performed: AN AD HOC LABOR EPIDURAL     Patient location during evaluation: Mother Baby Anesthesia Type: Epidural Level of consciousness: awake and alert Pain management: pain level controlled Vital Signs Assessment: post-procedure vital signs reviewed and stable Respiratory status: spontaneous breathing, nonlabored ventilation and respiratory function stable Cardiovascular status: stable Postop Assessment: no headache, no backache and epidural receding Anesthetic complications: no   No complications documented.  Last Vitals:  Vitals:   01/13/20 0330 01/13/20 0610  BP: 111/71 113/87  Pulse: 93 83  Resp: 16 16  Temp: 36.7 C 36.7 C  SpO2: 98% 99%    Last Pain:  Vitals:   01/13/20 0611  TempSrc:   PainSc: 0-No pain   Pain Goal:                   Kaisa Wofford

## 2020-01-13 NOTE — Progress Notes (Signed)
Pt "refusing" medications this AM due to her bringing her own medications from home and taking them. RN educated pt on medication safety and informed her that she can take her own medications, but RN would just need to take them to pharmacy to have them be verified first. Pt refusing this process, wanting to just take her own without anything else happening. RN educated again but just documented not given on EMR to satisfy documentation requirement. Per pt she did take her ferrous sulfate, hydroxychloroquine, amlodipine, and pantoprazole.

## 2020-01-13 NOTE — Progress Notes (Signed)
Patient refused her Zoloft. States that she bought her medication from home. Informed patient that the medication need to go to the pharmacy. Patient refused to send the medication to the pharmacy.Advised the patient of the  risk of taken her own medication without supervision. Advised the patient to call out when she takes her medication so that I can chart it. Advised patient that we are not responsible If she has issues from taken medication that we did not give. Patient states that she understand .Patient states that she does not want any schedule medication for the remainder of the night.

## 2020-01-13 NOTE — Lactation Note (Signed)
This note was copied from a baby's chart. Lactation Consultation Note Attempted to see mom but has been sleeping.  Patient Name: Joann Jones XIPJA'[id number] Date: 01/13/2020     Maternal Data    Feeding    LATCH Score                   Interventions    Lactation Tools Discussed/Used     Consult Status      Joann Jones, Joann Jones 01/13/2020, 4:06 AM

## 2020-01-13 NOTE — Lactation Note (Signed)
This note was copied from a baby's chart. Lactation Consultation Note  Patient Name: Joann Jones [id number] Date: 01/13/2020 Reason for consult: Initial assessment;NICU baby;Infant < 6lbs;Late-preterm 34-36.6wks   LC in to visit with P2 Mom and FOB of infant in the NICU.  Baby born at [redacted]w[redacted]d and birth weight 4 lbs 5 oz.     Mom has been pumping and transporting colostrum to NICU for baby.  Praised Mom for her commitment to providing baby with her EBM.  Mom started pumping and hand expressing within 2 hrs of birth.  Mom aware of the benefits of this to baby and her milk supply.   Mom has a Lansinoh DEBP at home (from her insurance company).  Mom told about Medela DEBP available to rent and in baby's room in the NICU.  Mom aware of importance of a hospital grade pump for a full milk supply.    Encouraged STS with baby.  Mom states she would like to pump and bottle feed.  Encouraged STS even though she may not wish to direct breast feed.  Reviewed importance of disassembling pump parts, washing, rinsing and air drying parts in separate bin.   NICU booklet and Lactation brochure given to Mom and encouraged her to review.   Interventions Interventions: Breast feeding basics reviewed;Skin to skin;Breast massage;Hand express;DEBP  Lactation Tools Discussed/Used Tools: Pump Breast pump type: Double-Electric Breast Pump WIC Program: No Pump Review: Setup, frequency, and cleaning;Milk Storage Initiated by:: MBU RN Date initiated:: 01/12/20   Consult Status Consult Status: Follow-up Date: 01/14/20 Follow-up type: In-patient    [name] 01/13/2020, 1:27 PM

## 2020-01-13 NOTE — Lactation Note (Signed)
This note was copied from a baby's chart. Lactation Consultation Note  Patient Name: Joann Jones [id number] Date: 01/13/2020  Mother is out of unit upon visit. Noted DEBP set up.  LC will come back to room at another time as possible.     Feeding Feeding Type: Formula Nipple Type: Nfant Slow Flow (purple)   [name] 01/13/2020, 9:31 AM

## 2020-01-13 NOTE — Progress Notes (Signed)
PPD #1 Having vertigi, n/v like last week, otherwise doing ok, baby in NICU Afeb, VSS Fundus firm, NT at U-1 Continue routine postpartum care, push fluids, Meclizine for vertigo

## 2020-01-13 NOTE — Lactation Note (Signed)
This note was copied from a baby's chart. Lactation Consultation Note  Patient Name: Joann Jones Date: 01/13/2020 Reason for consult: Follow-up assessment;NICU baby;Infant < 6lbs;Late-preterm 34-36.6wks  Visited with mom of a 74 hour old LPI NICU female < 5 lbs. Mom is a P2 and she's already pumping consistently every 2-3 hours, praised her for her efforts. She's been taking her EBM to the NICU into the colostrum bullets provided, she also has the labels.  Reviewed breastmilk storage guidelines, mom had a question about the effect of spicy food in breastmilk, explained to mom that due to maternal diet, breastfed babies are exposed to all kinds of flavors since birth and that she shouldn't restrict her diet, unless she identifies a certain food that causes an allergic reaction/response in baby.  Feeding plan:  1. Encouraged mom to continue pumping every 2-3 hours, ideally at least 8 times/24 hours 2. Mom will apply coconut oil prior pumping, LC requested it to the front desk  Dad present and supportive of BF, she was afraid that baby would not like mom's milk if she ate spicy foods. Parents reported all questions and concerns were answered, they're both aware of LC OP services and will call PRN.   Maternal Data    Feeding Feeding Type: Formula Nipple Type: Nfant Slow Flow (purple)  LATCH Score                   Interventions Interventions: Breast feeding basics reviewed;Coconut oil  Lactation Tools Discussed/Used Tools: Pump;Coconut oil Breast pump type: Double-Electric Breast Pump   Consult Status Consult Status: Follow-up Date: 01/14/20 Follow-up type: In-patient    Joann Jones 01/13/2020, 6:59 PM

## 2020-01-13 NOTE — Clinical Social Work Maternal (Signed)
CLINICAL SOCIAL WORK MATERNAL/CHILD NOTE  Patient Details  Name: Joann Jones MRN: [id number] Date of Birth: 10/13/1985  Date:  01/13/2020  Clinical Social Worker Initiating Note:  Leisl Spurrier, LCSW Date/Time: Initiated:  01/13/20/1152     Child's Name:  Joann Jones   Biological Parents:  Mother, Father (Father: Julian Rotter)   Need for Interpreter:  None   Reason for Referral:  Other (Comment), Behavioral Health Concerns (Infant's NICU Admission)   Address:  [address] [place] [address]    Phone number:  [phone] (home)     Additional phone number:   Household Members/Support Persons (HM/SP):   Household Member/Support Person 1, Household Member/Support Person 2   HM/SP Name Relationship DOB or Age  HM/SP -1 Julian Miniga FOB/Husband    HM/SP -2 Juliana Balbi daughter 01/06/15  HM/SP -3        HM/SP -4        HM/SP -5        HM/SP -6        HM/SP -7        HM/SP -8          Natural Supports (not living in the home):  Parent, Immediate Family   Professional Supports: None   Employment: Full-time   Type of Work: Lab Tech   Education:  Attending college (2 associate degrees and currently enrolled for bachelors degree)   Homebound arranged:    Financial Resources:  Private Insurance   Other Resources:      Cultural/Religious Considerations Which May Impact Care:    Strengths:  Ability to meet basic needs , Home prepared for child , Psychotropic Medications   Psychotropic Medications:  Zoloft      Pediatrician:       Pediatrician List:   [place]    [place]    [place]    [place]    [place]    [place]      Pediatrician Fax Number:    Risk Factors/Current Problems:  Mental Health Concerns    Cognitive State:  Able to Concentrate , Alert , Insightful , Goal Oriented , Linear Thinking    Mood/Affect:  Comfortable , Calm , Interested , Relaxed    CSW Assessment: CSW met  with MOB at bedside to discuss consult for infant's NICU admission and behavioral health concerns. CSW introduced self and explained reason for consult. MOB was welcoming, pleasant and remained engaged during assessment. MOB reported that she resides with FOB/Husband and her older daughter. 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 MOB reported that they have all items needed to care for infant including a car seat, crib and cradle. CSW inquired about MOB's support system, MOB reported that her husband, parents, husband's father and a lot of other people are supports.   CSW inquired about MOB's mental health history. MOB reported that she was diagnosed with anxiety 5 years ago after she had her daughter. MOB reported that her postpartum anxiety started when her daughter was 2 months and she experienced chest tightness and phantom cries. MOB reported that she was prescribed Zoloft which was helpful. MOB reported that her postpartum anxiety transitioned into generalized anxiety and she continues to take Zoloft. MOB denied any additional mental health history. CSW inquired about how MOB was feeling emotionally after giving birth, MOB reported that she was feeling chill. MOB presented calm and did not demonstrate any acute mental health signs/symptoms. MOB possessed insight   about her mental health. CSW assessed for safety, MOB denied SI, HI and domestic violence.   CSW provided education regarding the baby blues period vs. perinatal mood disorders, discussed treatment and gave resources for mental health follow up if concerns arise.  CSW recommends self-evaluation during the postpartum time period using the New Mom Checklist from Postpartum Progress and encouraged MOB to contact a medical professional if symptoms are noted at any time.    CSW provided review of Sudden Infant Death Syndrome (SIDS) precautions.    CSW and MOB discussed infant's NICU admission. MOB reported that she feels  up in the air about being informed regarding infant's care. MOB reported that she is awaiting a call with an update on infant's care. CSW informed MOB about the NICU, what to expect and resources/supports available while infant is admitted to the NICU. MOB reported that meal vouchers would be helpful, CSW provided 6 meal vouchers. MOB denied any transportation barriers with visiting infant in the NICU. MOB denied any additional needs/concerns.   CSW will continue to offer resources/supports while infant is admitted to the NICU.   CSW Plan/Description:  Sudden Infant Death Syndrome (SIDS) Education, Perinatal Mood and Anxiety Disorder (PMADs) Education, Other Patient/Family Education    Aveya Beal L Jayleen Afonso, LCSW 01/13/2020, 11:55 AM 

## 2020-01-14 MED ORDER — MECLIZINE HCL 25 MG PO TABS: 25.0000 mg | ORAL_TABLET | Freq: Four times a day (QID) | ORAL | 0 refills | Status: AC | PRN

## 2020-01-14 MED ORDER — IBUPROFEN 600 MG PO TABS: 600.0000 mg | ORAL_TABLET | Freq: Four times a day (QID) | ORAL | 0 refills | Status: AC

## 2020-01-14 NOTE — Progress Notes (Signed)
Patient states that she was having pain  In her back. Refused any pain medication. Patient reports being dizzy. Patient refused medication for the dizziness.Educated patient on the important of taking the medication and to call if she decides she wants the medication.Remind the patient to get up slowly.

## 2020-01-14 NOTE — Progress Notes (Signed)
PPD #2 Doing ok, some vertigo off and on, baby stable in NICU Afeb, VSS D/c home

## 2020-01-14 NOTE — Discharge Instructions (Signed)
As per discharge pamphlet °

## 2020-01-14 NOTE — Discharge Summary (Signed)
Postpartum Discharge Summary      Patient Name: Joann Jones DOB: 12/19/1985 MRN: [id number]  Date of admission: 01/12/2020 Delivery date:01/12/2020  Delivering provider: Sherian Rein  Date of discharge: 01/14/2020  Admitting diagnosis: IUGR (intrauterine growth restriction) affecting care of mother [O36.5990] Intrauterine pregnancy: [redacted]w[redacted]d     Secondary diagnosis:  Principal Problem:   SVD (spontaneous vaginal delivery) Active Problems:   IUGR (intrauterine growth restriction) affecting care of mother     Discharge diagnosis: Preterm Pregnancy Delivered, CHTN and IUGR, lupus                                               Hospital course: Induction of Labor With Vaginal Delivery   34 y.o. yo [id number] at [redacted]w[redacted]d was admitted to the hospital 01/12/2020 for induction of labor.  Indication for induction: IUGR.  Patient had an uncomplicated labor course as follows: Membrane Rupture Time/Date: 12:55 PM ,01/12/2020   Delivery Method:Vaginal, Spontaneous  Episiotomy: None  Lacerations:  None  Details of delivery can be found in separate delivery note.  Patient had a routine postpartum course, developed some vertigo treated with meclizine. Patient is discharged home 01/14/20.  Newborn Data: Birth date:01/12/2020  Birth time:6:34 PM  Gender:Female  Living status:Living  Apgars:4 ,8  Weight:1956 g    Physical exam  Vitals:   01/13/20 1429 01/13/20 1857 01/13/20 2128 01/14/20 0605  BP: 122/80 118/81 120/70 113/76  Pulse: 93 89 68 95  Resp: 16 17 18 18   Temp:   97.7 F (36.5 C) 98.1 F (36.7 C)  TempSrc:   Oral Oral  SpO2:  100% 100% 100%  Weight:      Height:       General: alert Lochia: appropriate Uterine Fundus: firm  Labs: Lab Results  Component Value Date   WBC 11.4 (H) 01/13/2020   HGB 10.8 (L) 01/13/2020   HCT 33.2 (L) 01/13/2020   MCV 96.2 01/13/2020   PLT 124 (L) 01/13/2020   CMP Latest Ref Rng & Units 01/12/2020  Glucose 70 - 99 mg/dL 03/13/2020)  BUN 6  - 20 mg/dL 389(H)  Creatinine <7(D - 1.00 mg/dL 4.28  Sodium 7.68 - 115 mmol/L 139  Potassium 3.5 - 5.1 mmol/L 3.4(L)  Chloride 98 - 111 mmol/L 106  CO2 22 - 32 mmol/L 24  Calcium 8.9 - 10.3 mg/dL 726)  Total Protein 6.5 - 8.1 g/dL 2.0(B)  Total Bilirubin 0.3 - 1.2 mg/dL 5.5(H)  Alkaline Phos 38 - 126 U/L 134(H)  AST 15 - 41 U/L 27  ALT 0 - 44 U/L 21   Edinburgh Score: Edinburgh Postnatal Depression Scale Screening Tool 01/12/2020  I have been able to laugh and see the funny side of things. (No Data)      After visit meds:  Allergies as of 01/14/2020      Reactions   Codeine    Stomach pain   Septra [sulfamethoxazole-trimethoprim] Other (See Comments)   Decrease platlets   Tegaderm Ag Mesh [silver] Itching      Medication List    STOP taking these medications   aspirin EC 81 MG tablet   DICLEGIS PO   omeprazole 40 MG capsule Commonly known as: PRILOSEC     TAKE these medications   amLODipine 2.5 MG tablet Commonly known as: NORVASC Take 2.5 mg by mouth daily.   ferrous sulfate  325 (65 FE) MG EC tablet Take 1 tablet (325 mg total) by mouth every other day.   hydroxychloroquine 200 MG tablet Commonly known as: PLAQUENIL Take by mouth daily.   ibuprofen 600 MG tablet Commonly known as: ADVIL Take 1 tablet (600 mg total) by mouth every 6 (six) hours.   meclizine 25 MG tablet Commonly known as: ANTIVERT Take 1 tablet (25 mg total) by mouth every 6 (six) hours as needed for dizziness or nausea.   PRENATAL VITAMINS PO Take by mouth.   sertraline 100 MG tablet Commonly known as: ZOLOFT Take by mouth at bedtime.        Discharge home in stable condition Infant Feeding: Breast Infant Disposition:NICU Discharge instruction: per After Visit Summary and Postpartum booklet. Activity: Advance as tolerated. Pelvic rest for 6 weeks.  Diet: routine diet Postpartum Appointment:6 weeks Follow up Visit:  Follow-up Information    Bovard-Stuckert, Jody, MD.  Schedule an appointment as soon as possible for a visit in 6 week(s).   Specialty: Obstetrics and Gynecology Contact information: [address] [place] [address] [phone]                   01/14/2020 Zenaida Niece, MD

## 2020-01-17 ENCOUNTER — Ambulatory Visit: Payer: Self-pay

## 2020-01-17 LAB — SURGICAL PATHOLOGY

## 2020-01-17 NOTE — Lactation Note (Addendum)
This note was copied from a baby's chart. Lactation Consultation Note  Patient Name: Joann Jones [id number] Date: 01/17/2020  Mom not at baby's bedside so telephone call to mom on her cell phone.   Mom reports that she is using her Lansinoh pump at home and at the baby's bedside. Mom is not breastfeeding at this time she is completely pumping. Mom reports she does have some questions about pumping.  Mom inquired about how long to pump.  Mom reports she usually pumps for 30-40 minutes.  Discussed trying to keep it to 30 minutes unless she is still really uncomfortable and flowing well.   Mom reports she has some redness and a hard area at the top of her breast.  Urged mom to continue to watch and feel those areas.  Mom denies flu like symptoms and reports nipples are intact.  Discussed heat/massage with pumping and maybe trying larger  Flanges. Mom reports the area  feels better when she pumps but never completely goes away. Mom no longer taking her prescribed ibuprofen. Mom has been using 25 mm duration of pumping. Discussed trying 28 mm flanges. Mom reports her Lansinoh DEBP came with those two sizes. Mom reports she tries to pump every few hours.  Did not discuss using hospital/multiu user breastpump at this time and did not discuss how much milk mom was getting. Let mom know we would follow up with her tomorrow. Urged her to call lactation as needed.     Maternal Data    Feeding Feeding Type: Breast Milk with Formula added Nipple Type: Nfant Slow Flow (purple)  LATCH Score                   Interventions    Lactation Tools Discussed/Used     Consult Status      Saia Derossett Michaelle Copas 01/17/2020, 4:51 PM

## 2020-01-18 ENCOUNTER — Ambulatory Visit: Payer: Self-pay

## 2020-01-18 NOTE — Lactation Note (Addendum)
This note was copied from a baby's chart. Lactation Consultation Note  Patient Name: Joann Jones [id number] Date: 01/18/2020  Both parents at baby's bedside on arrival.  Mom reports her pain is not as bad today but she feels horrible and she is having left flank pain as well. Mom reports the redness is gone. Discussed flange fit and pumping with mom.  Mom does not pump at the hospital.  Only pumps at home with her lansinoh pump.  Mom reports her nipples are also sore. Mom reports that she exclusively pumped with her first baby and she had a different pump and did not have sore nipples or any issues.  Mom reports she only plans to pump with this one as well and does not plan to put baby to the breast. Discussed with mom trying hospital/muiltiuser DEBP while she is here and when she comes to see baby so LC can observe her pumping.  Mom agreed.  RN got DEBP, kit and LC observed mom pumping.  Prior to pumping LC measured moms nipples.  Mom needed a 75m on left and a 265mon right.  Her personal pump has 25 mm flanges.   Stayed duration of pumping.  Used heat. Assisted mom in massage and hand expression and mom reports the left breast is finally feeling better.  Urged mom to call lactation as needed.    Interventions    Lactation Tools Discussed/Used     Consult Status      HoRollen Sox0/13/2021, 9:08 PM

## 2020-01-19 ENCOUNTER — Ambulatory Visit: Payer: Self-pay

## 2020-01-19 NOTE — Lactation Note (Signed)
This note was copied from a baby's chart. Lactation Consultation Note  Patient Name: Joann Jones Date: 01/19/2020    Mother is out of room at the time of visit. LC will come back to room at another time as possible.    Feeding Feeding Type: Bottle Fed - Breast Milk Nipple Type: Dr. Levert Feinstein Preemie  [name] [name] [name] 01/19/2020, 4:47 PM

## 2020-02-17 ENCOUNTER — Telehealth: Payer: Self-pay | Admitting: Lactation Services

## 2020-02-17 NOTE — Telephone Encounter (Signed)
Pt would like info on breast pump rental, prices, etc.  This was given to her, she plans on coming to [facility] today to rent a Symphony breast pump

## 2020-04-17 ENCOUNTER — Other Ambulatory Visit: Payer: Self-pay | Admitting: Family Medicine

## 2020-04-17 DIAGNOSIS — R7989 Other specified abnormal findings of blood chemistry: Secondary | ICD-10-CM

## 2020-04-20 ENCOUNTER — Ambulatory Visit (HOSPITAL_COMMUNITY)
Admission: RE | Admit: 2020-04-20 | Discharge: 2020-04-20 | Disposition: A | Discharge status: Home / Self Care | Payer: BC Managed Care – PPO | Source: Ambulatory Visit | Attending: Family Medicine | Admitting: Family Medicine

## 2020-04-20 ENCOUNTER — Other Ambulatory Visit: Payer: Self-pay

## 2020-04-20 DIAGNOSIS — R7989 Other specified abnormal findings of blood chemistry: Secondary | ICD-10-CM | POA: Insufficient documentation

## 2020-05-02 ENCOUNTER — Other Ambulatory Visit (HOSPITAL_COMMUNITY): Payer: Self-pay | Admitting: Family Medicine

## 2020-05-02 ENCOUNTER — Other Ambulatory Visit: Payer: Self-pay | Admitting: Family Medicine

## 2020-05-02 DIAGNOSIS — R7989 Other specified abnormal findings of blood chemistry: Secondary | ICD-10-CM

## 2021-07-17 IMAGING — US US MFM UA CORD DOPPLER
1 series · 14 of 28 positions shown · non-contrast
Comparison: none

[Series 1: us mfm ua cord doppler · 66 acquisitions, 14 frames shown]
[im 3/66]
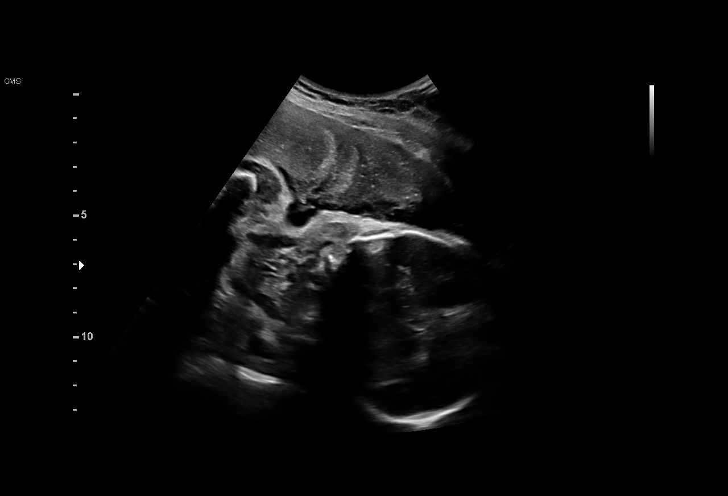
[im 8/66]
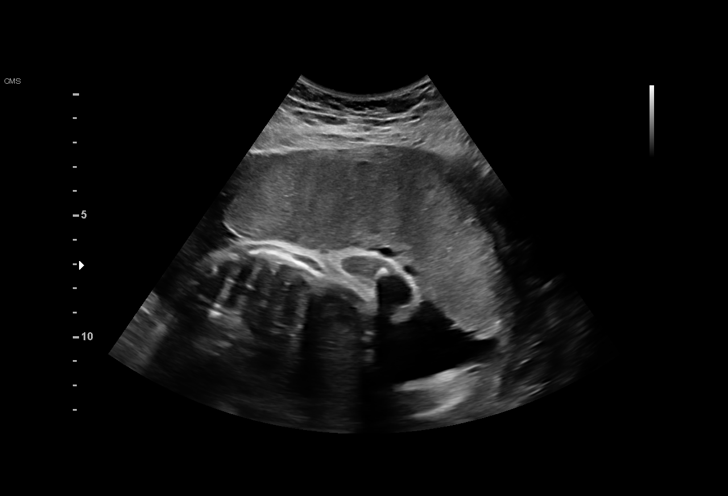
[im 13/66]
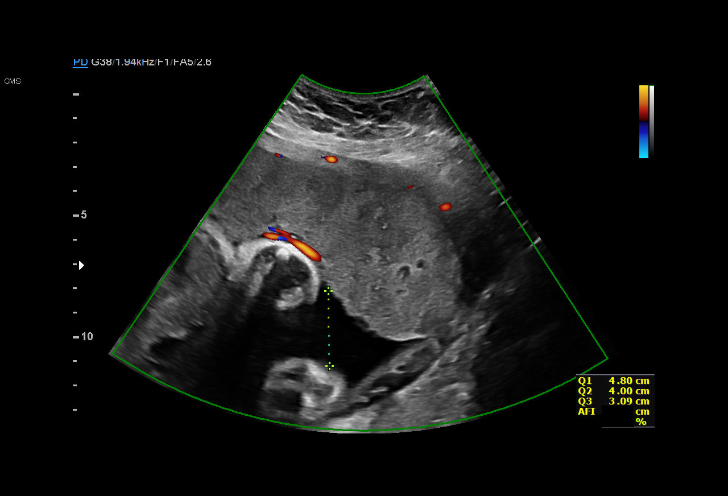
[im 17/66]
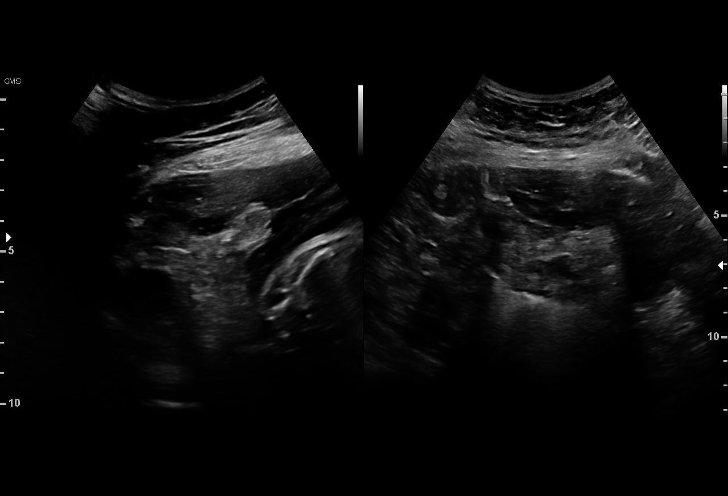
[im 22/66]
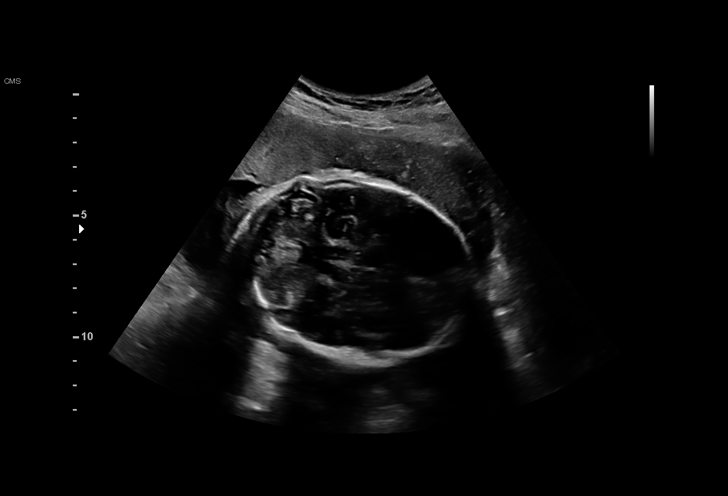
[im 27/66]
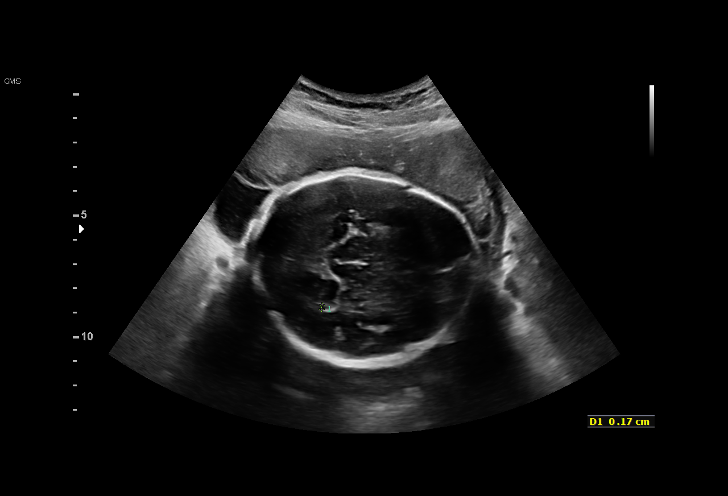
[im 32/66]
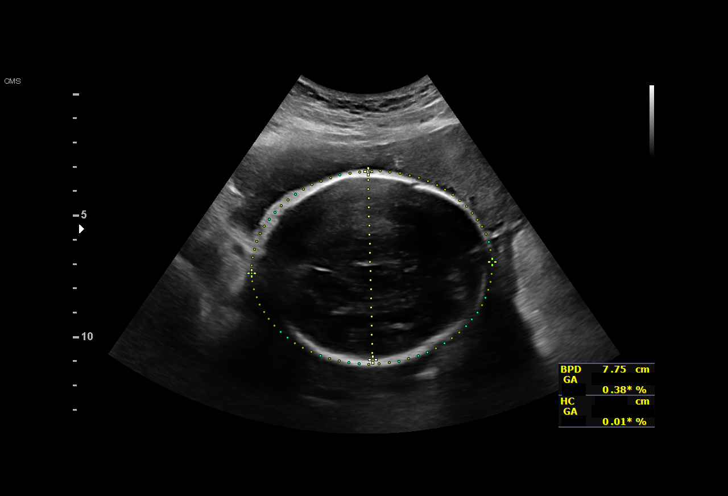
[im 37/66]
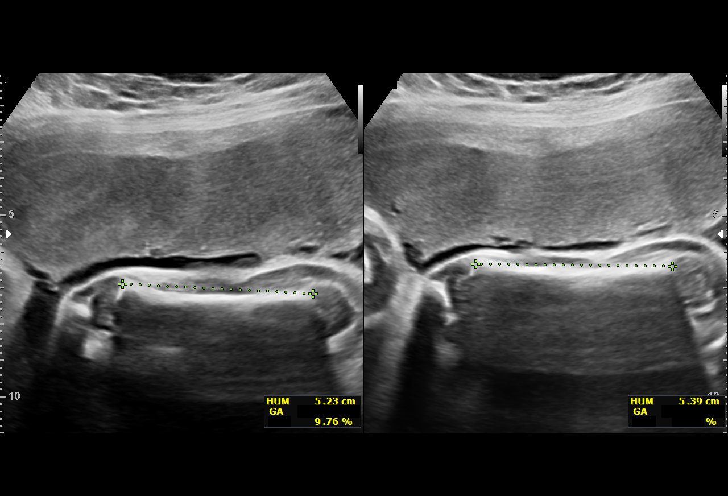
[im 41/66]
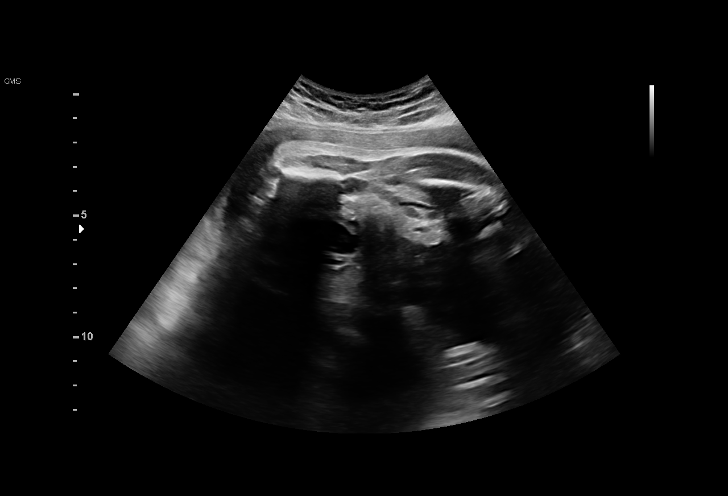
[im 46/66]
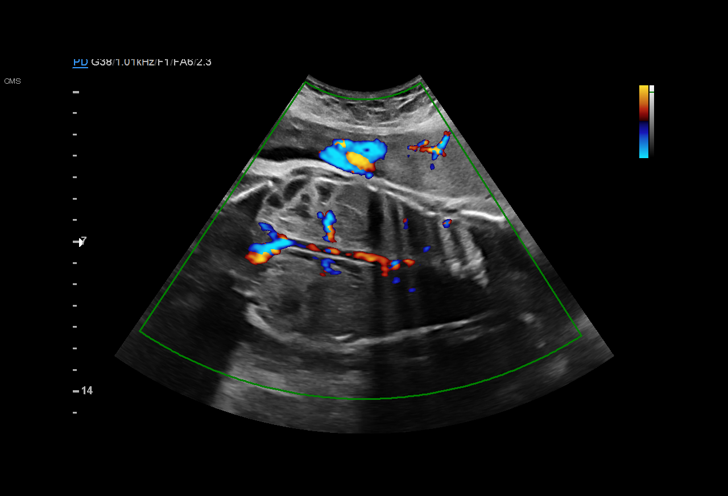
[im 51/66]
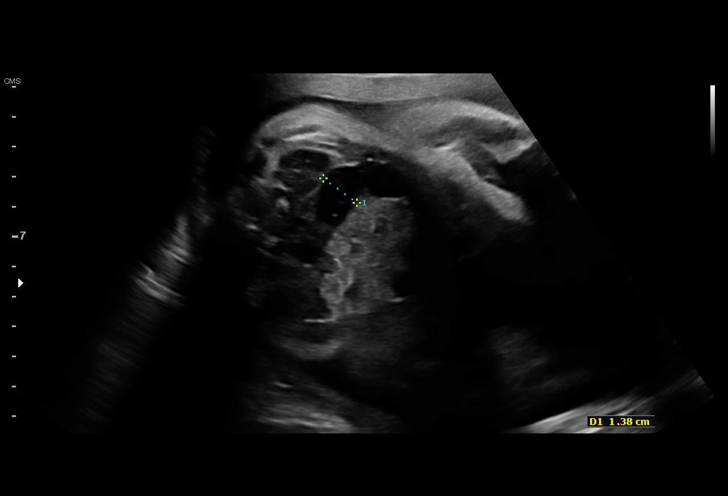
[im 56/66]
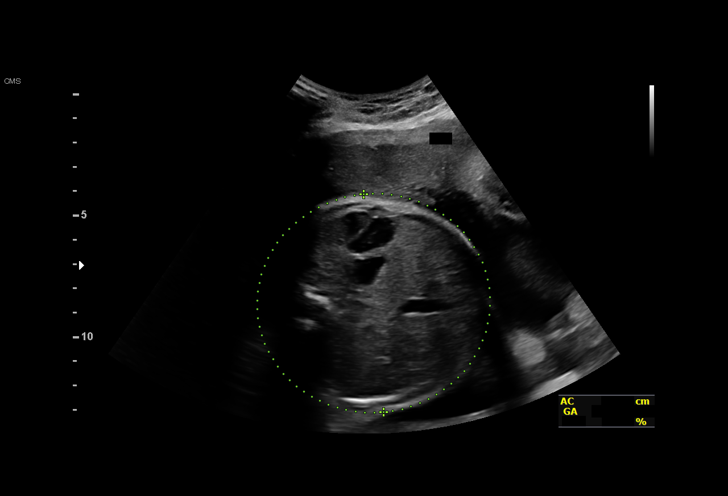
[im 61/66]
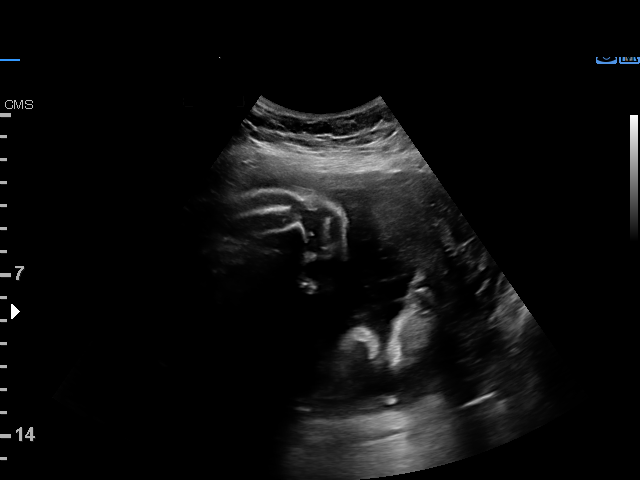
[im 66/66]
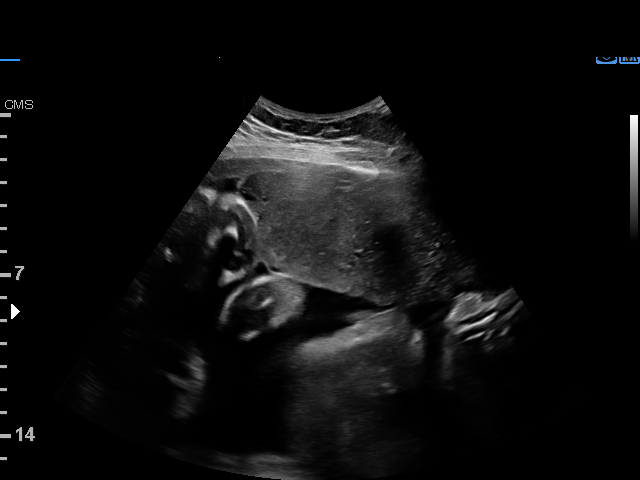

[14 of 28 positions shown; findings below may reference images not displayed]

1  US MFM UA CORD DOPPLER                76820.02    GUARANTEE
                                                      NIELSEN
                                                      NIELSEN

Indications

 Encounter for other antenatal screening
 follow-up
 Poor growth
 Systemic lupus complicating pregnancy,         O26.893,
 third trimester
 Hypertension - Chronic/Pre-existing
 (Norvasc)
 Thrombocytopenia affecting pregnancy,          O99.119,
 antepartum
 34 weeks gestation of pregnancy
Vital Signs

                                                Height:        5'1"
Fetal Evaluation

 Num Of Fetuses:         1
 Fetal Heart Rate(bpm):  135
 Cardiac Activity:       Observed
 Presentation:           Cephalic
 Placenta:               Anterior
 P. Cord Insertion:      Visualized
 Amniotic Fluid
 AFI FV:      Within normal limits

 AFI Sum(cm)     %Tile       Largest Pocket(cm)
 11.9            34

 RUQ(cm)                     LUQ(cm)        LLQ(cm)
 4.8                         4
Biophysical Evaluation

 Amniotic F.V:   Within normal limits       F. Tone:        Observed
 F. Movement:    Observed                   Score:          [DATE]
 F. Breathing:   Observed
Biometry

 BPD:      77.5  mm     G. Age:  31w 1d        < 1  %    CI:        75.94   %    70 - 86
                                                         FL/HC:      21.2   %    19.4 -
 HC:      281.9  mm     G. Age:  30w 6d        < 1  %    HC/AC:      0.98        0.96 -
 AC:      288.3  mm     G. Age:  32w 6d         14  %    FL/BPD:     77.0   %    71 - 87
 FL:       59.7  mm     G. Age:  31w 1d        < 1  %    FL/AC:      20.7   %    20 - 24
 HUM:      53.2  mm     G. Age:  31w 0d        < 5  %

 Est. FW:    8984  gm      4 lb 2 oz    2.9  %
OB History

 Blood Type:   A+
 Gravidity:    2         Term:   1        Prem:   0        SAB:   0
 TOP:          0       Ectopic:  0        Living: 1
Gestational Age

 Clinical EDD:  34w 3d                                        EDD:   02/04/20
 U/S Today:     31w 4d                                        EDD:   02/24/20
 Best:          34w 3d     Det. By:  Clinical EDD             EDD:   02/04/20
Doppler - Fetal Vessels

 Umbilical Artery
  S/D     %tile      RI    %tile      PI    %tile            ADFV    RDFV
  2.49       49     0.6       56    0.87       54               No      No

Impression

 Single intrauterine pregnancy here for a follow up growth
 chronic hypertension (Blood pressure is normal today.)
 Normal anatomy with measurements less than dates due to
 IUGR EFW 2.1th%
 There is good fetal movement and amniotic fluid volume
 The UA Dopplers are normal.

 Biophyiscal profile [DATE]
Recommendations

 Continue weekly testing with UA dopplers
 Plan for delivery at 37 weeks.

## 2021-07-24 IMAGING — US US MFM UA CORD DOPPLER
1 series · 15 of 28 positions shown · non-contrast
Comparison: none

[Series 1: us mfm ua cord doppler · 37 acquisitions, 15 frames shown]
[im 1/37]
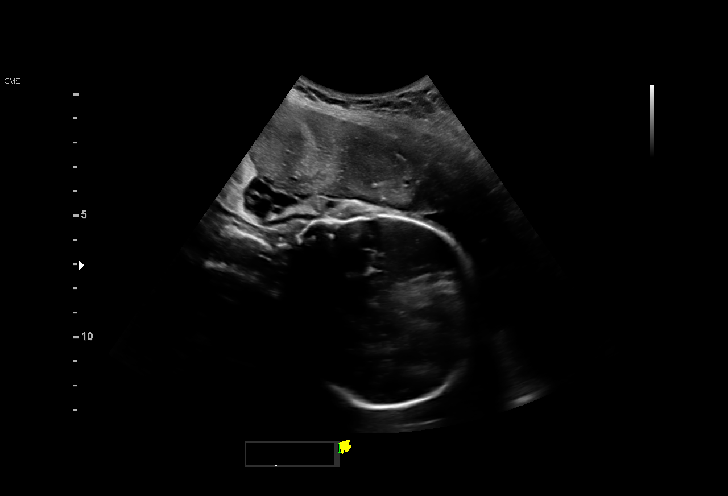
[im 3/37]
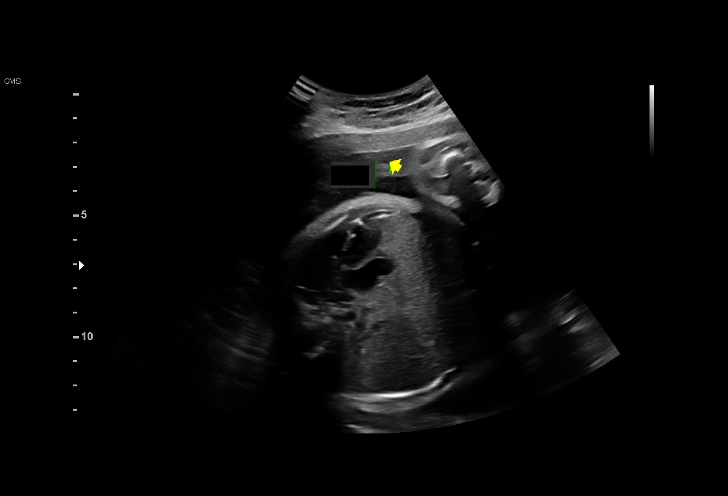
[im 6/37]
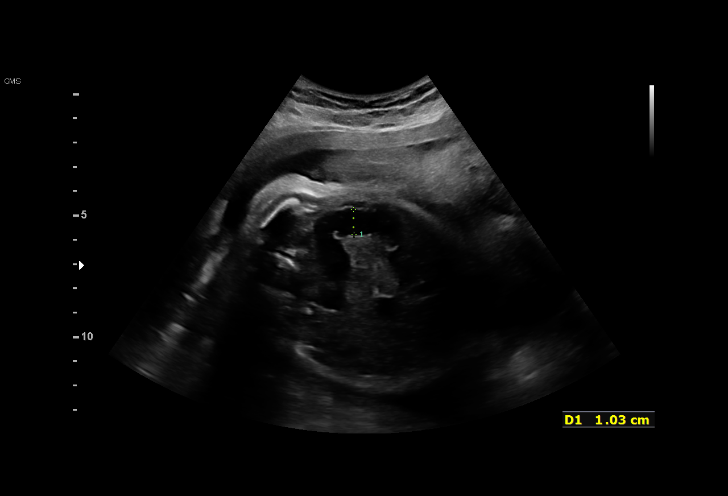
[im 9/37]
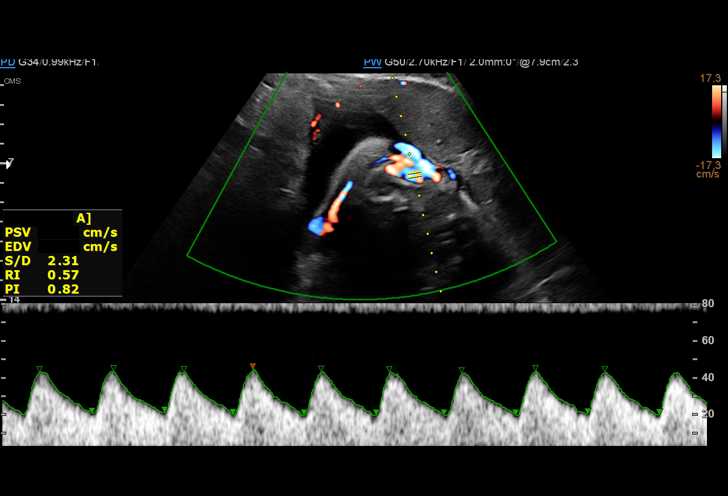
[im 11/37]
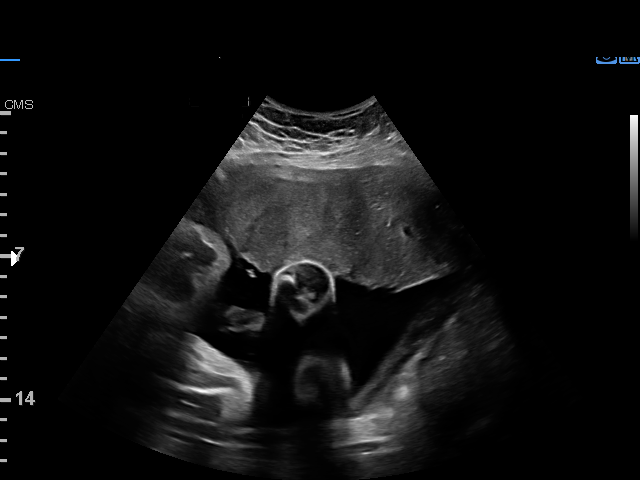
[im 14/37]
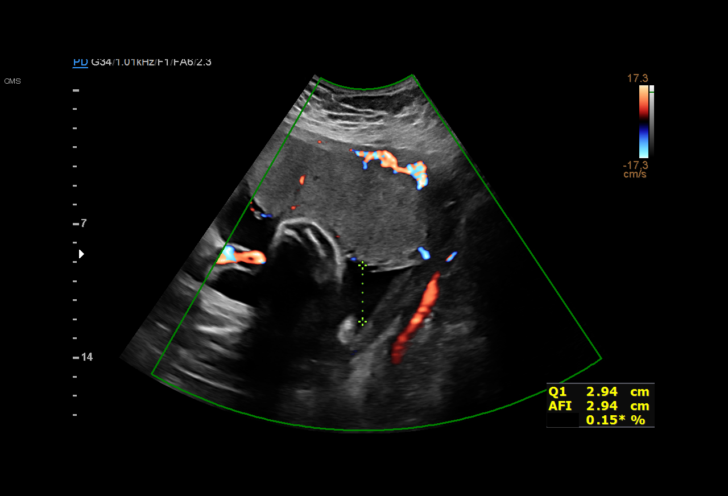
[im 17/37]
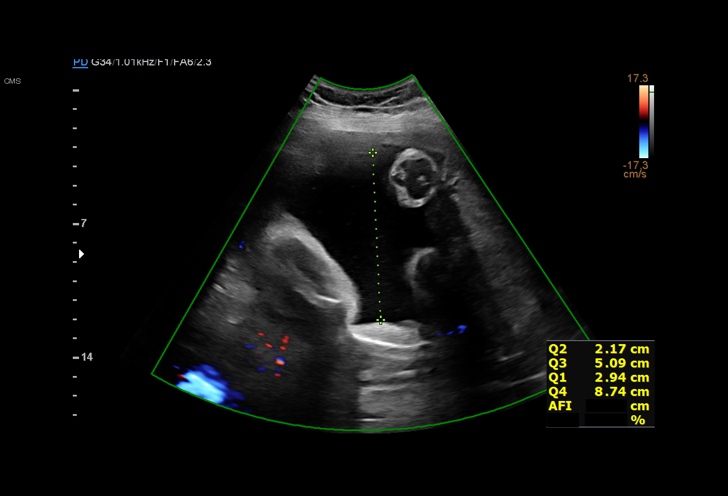
[im 19/37]
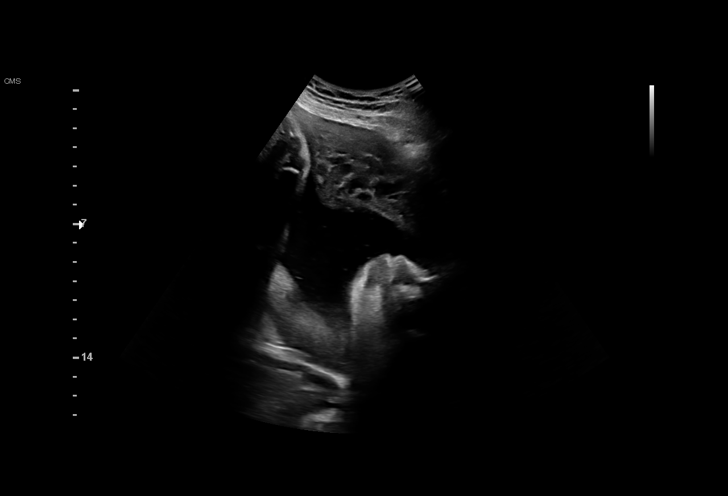
[im 21/37]
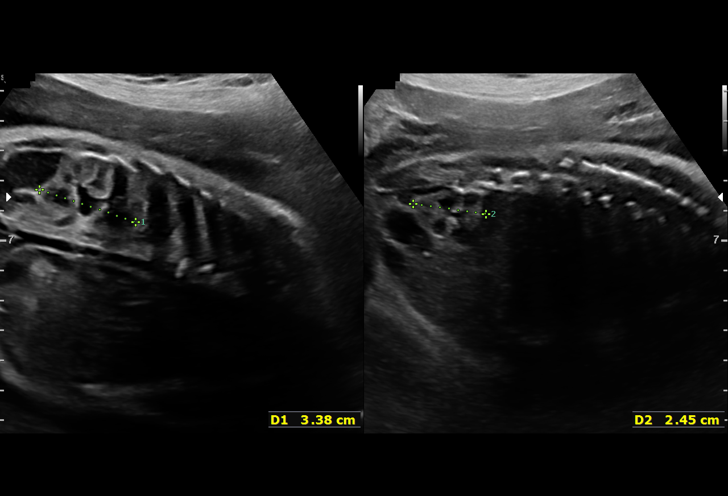
[im 23/37]
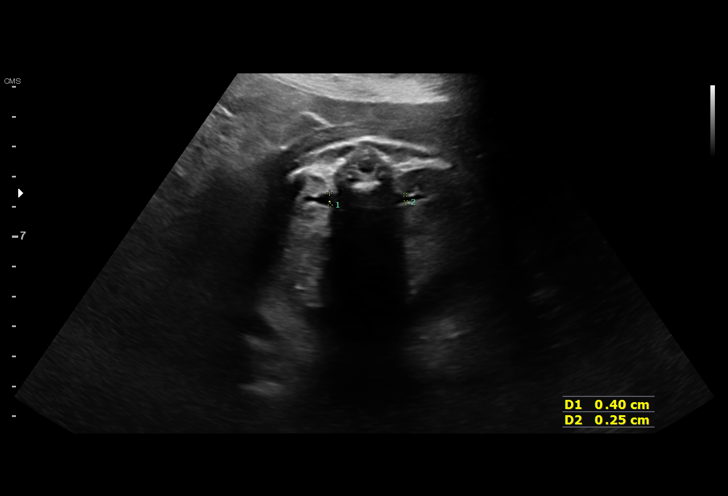
[im 26/37]
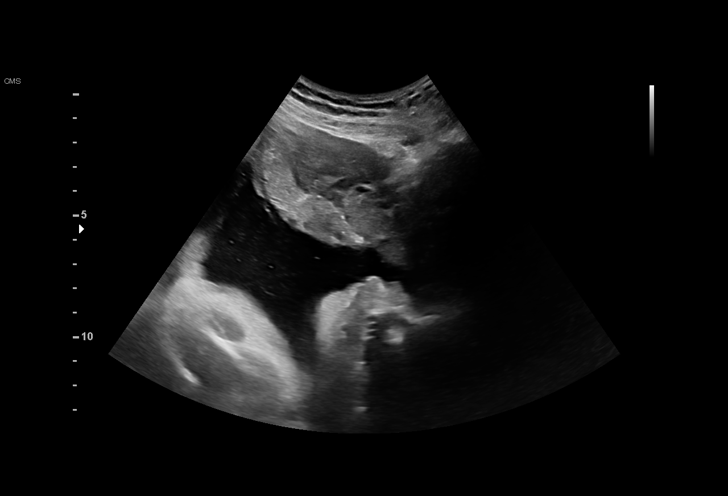
[im 29/37]
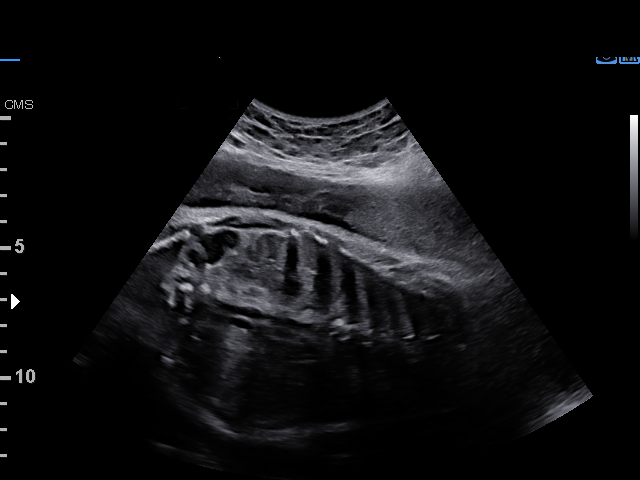
[im 31/37]
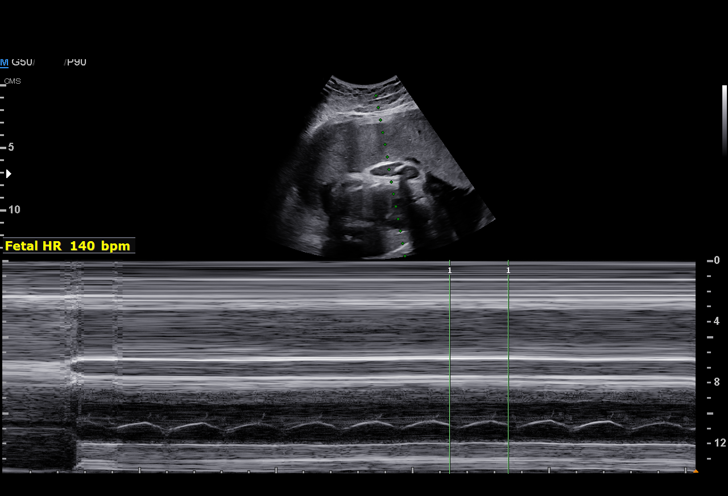
[im 34/37]
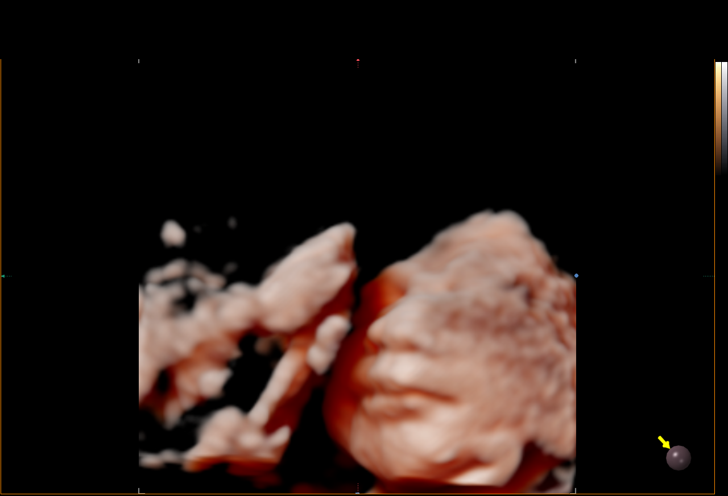
[im 37/37]
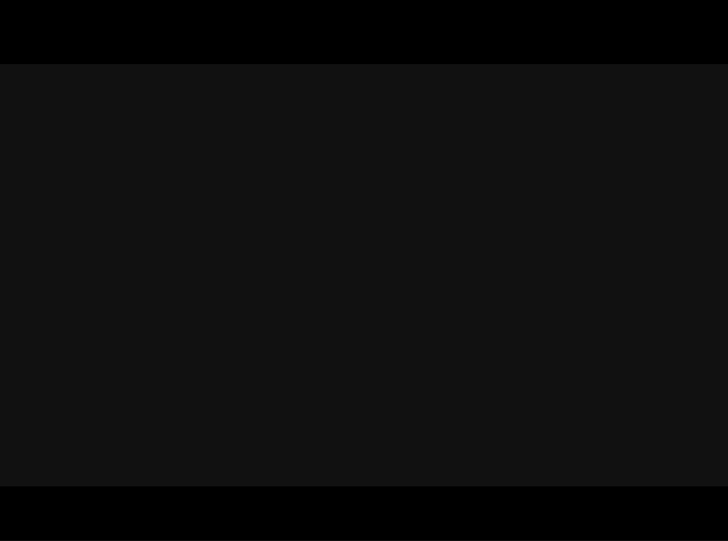

[15 of 28 positions shown; findings below may reference images not displayed]

1  US MFM UA CORD DOPPLER                76820.02    TANU
                                                      GINO

Indications

 Encounter for other antenatal screening
 follow-up
 Poor growth
 Systemic lupus complicating pregnancy,         O26.893,
 third trimester
 Hypertension - Chronic/Pre-existing
 (Norvasc)
 Thrombocytopenia affecting pregnancy,          O99.119,
 antepartum
 35 weeks gestation of pregnancy
Vital Signs

                                                Height:        5'1"
Fetal Evaluation

 Num Of Fetuses:         1
 Fetal Heart Rate(bpm):  140
 Cardiac Activity:       Observed
 Presentation:           Cephalic

 Amniotic Fluid
 AFI FV:      Within normal limits

 AFI Sum(cm)     %Tile       Largest Pocket(cm)
 18.9            70

 RUQ(cm)       RLQ(cm)       LUQ(cm)        LLQ(cm)

Biophysical Evaluation

 Amniotic F.V:   Within normal limits       F. Tone:        Observed
 F. Movement:    Observed                   Score:          [DATE]
 F. Breathing:   Observed
OB History

 Blood Type:   A+
 Gravidity:    2         Term:   1        Prem:   0        SAB:   0
 TOP:          0       Ectopic:  0        Living: 1
Gestational Age

 Clinical EDD:  35w 3d                                        EDD:   02/04/20
 Best:          35w 3d     Det. By:  Clinical EDD             EDD:   02/04/20
Doppler - Fetal Vessels

 Umbilical Artery
  S/D     %tile      RI    %tile                             ADFV    RDFV
  2.31       41    0.57       47                                No      No

Cervix Uterus Adnexa

 Cervix
 Not visualized (advanced GA >68wks)
Impression

 Antenatal testing due to IUGR with an EFW 7.7th%
 Biophysical profile [DATE] with good fetal movement and
 amniotic fluid volume
 UA Dopplers are normal with no evidence of AEDF or REDF
Recommendations

 Continue weekly testing with UA Dopplers
 Consider delivery at 37 weeks.

## 2021-09-16 NOTE — Telephone Encounter (Signed)
No additional notes. Close encounter. 

## 2021-09-19 ENCOUNTER — Ambulatory Visit
Admission: RE | Admit: 2021-09-19 | Discharge: 2021-09-19 | Disposition: A | Discharge status: Home / Self Care | Payer: Managed Care, Other (non HMO) | Source: Ambulatory Visit | Attending: Family Medicine | Admitting: Family Medicine

## 2021-09-19 VITALS — BP 129/88 | HR 93 | Temp 98.7°F | Resp 16

## 2021-09-19 DIAGNOSIS — N3001 Acute cystitis with hematuria: Secondary | ICD-10-CM | POA: Diagnosis not present

## 2021-09-19 LAB — POCT URINALYSIS DIP (MANUAL ENTRY)
Bilirubin, UA: NEGATIVE
Glucose, UA: NEGATIVE mg/dL
Ketones, POC UA: NEGATIVE mg/dL
Nitrite, UA: POSITIVE — AB
Protein Ur, POC: >=300 mg/dL — AB
Spec Grav, UA: >=1.03 — AB (ref 1.010–1.025)
Urobilinogen, UA: 1 E.U./dL
pH, UA: 6 (ref 5.0–8.0)

## 2021-09-19 LAB — POCT URINE PREGNANCY: Preg Test, Ur: NEGATIVE

## 2021-09-19 MED ORDER — PHENAZOPYRIDINE HCL 200 MG PO TABS: 200.0000 mg | ORAL_TABLET | Freq: Three times a day (TID) | ORAL | 0 refills | Status: AC | PRN

## 2021-09-19 MED ORDER — NITROFURANTOIN MONOHYD MACRO 100 MG PO CAPS: 100.0000 mg | ORAL_CAPSULE | Freq: Two times a day (BID) | ORAL | 0 refills | Status: AC

## 2021-09-19 NOTE — ED Triage Notes (Signed)
Pt presents with dysuria, hematuria, and back pain x 2 days

## 2021-09-19 NOTE — ED Provider Notes (Signed)
Joann Jones    CSN: [id number] Arrival date & time: 09/19/21  1303      History   Chief Complaint Chief Complaint  Patient presents with   Urinary Frequency    Hematuria & dysuria 3 days - Entered by patient   Dysuria   Hematuria   Back Pain    HPI Joann Jones is a 36 y.o. female.   HPI Joann Jones is a 36 y.o. female presents for evaluation of urinary frequency, urgency and dysuria x 2 days, with left flank pain. Denies fever or chills. Took AZO yesterday. Uncertain of prior urine pathology. No LMP recorded. (Menstrual status: Oral contraceptives).   Past Medical History:  Diagnosis Date   Anemia    Anxiety    Asthma    with exertion   Collagen vascular disease ([place])    Complication of anesthesia    hard to wake up   Dyspnea    Dysrhythmia    tachycardia   GERD (gastroesophageal reflux disease)    Hypertension    IBS (irritable bowel syndrome)    ITP (idiopathic thrombocytopenic purpura)    Lupus ([facility])    Migraines    Palpitations    Pelvic kidney    Sleep apnea    SVD (spontaneous vaginal delivery) 01/12/2020    Patient Active Problem List   Diagnosis Date Noted   IUGR (intrauterine growth restriction) affecting care of mother 01/12/2020   SVD (spontaneous vaginal delivery) 01/12/2020   Lupus ([place])    IBS (irritable bowel syndrome)    GERD (gastroesophageal reflux disease)    Shortness of breath during pregnancy 10/26/2019   Anemia during pregnancy 10/26/2019    Past Surgical History:  Procedure Laterality Date   COLONOSCOPY WITH PROPOFOL N/A 07/18/2016   Procedure: COLONOSCOPY WITH PROPOFOL;  Surgeon: Manya Silvas, MD;  Location: [facility] ENDOSCOPY;  Service: Endoscopy;  Laterality: N/A;   ESOPHAGOGASTRODUODENOSCOPY (EGD) WITH PROPOFOL N/A 07/18/2016   Procedure: ESOPHAGOGASTRODUODENOSCOPY (EGD) WITH PROPOFOL;  Surgeon: Manya Silvas, MD;  Location: [facility] ENDOSCOPY;  Service: Endoscopy;  Laterality: N/A;   HERNIA REPAIR     WRIST  SURGERY Right     OB History     Gravida  2   Para  2   Term  1   Preterm  1   AB      Living  2      SAB      IAB      Ectopic      Multiple  0   Live Births  2            Home Medications    Prior to Admission medications   Medication Sig Start Date End Date Taking? Authorizing Provider  amLODipine (NORVASC) 2.5 MG tablet Take 2.5 mg by mouth daily.   Yes [provider]  hydroxychloroquine (PLAQUENIL) 200 MG tablet Take by mouth daily.   Yes [provider]  levonorgestrel-ethinyl estradiol (SEASONALE) 0.15-0.03 MG tablet Take 1 tablet by mouth daily. 04/19/21  Yes [provider]  nitrofurantoin, macrocrystal-monohydrate, (MACROBID) 100 MG capsule Take 1 capsule (100 mg total) by mouth 2 (two) times daily. 09/19/21  Yes Scot Jun, FNP  omeprazole (PRILOSEC) 40 MG capsule Take by mouth. 10/15/18  Yes [provider]  phenazopyridine (PYRIDIUM) 200 MG tablet Take 1 tablet (200 mg total) by mouth 3 (three) times daily as needed for pain. 09/19/21  Yes Scot Jun, FNP  Prenatal Vit-Fe Fumarate-FA (PRENATAL VITAMINS  PO) Take by mouth.   Yes [provider]  sertraline (ZOLOFT) 100 MG tablet Take by mouth at bedtime.  09/21/18 09/19/21 Yes [provider]  ferrous sulfate 325 (65 FE) MG EC tablet Take 1 tablet (325 mg total) by mouth every other day. 10/26/19   Raelyn Mora, CNM  ibuprofen (ADVIL) 600 MG tablet Take 1 tablet (600 mg total) by mouth every 6 (six) hours. 01/14/20   Meisinger, Tawanna Cooler, MD  meclizine (ANTIVERT) 25 MG tablet Take 1 tablet (25 mg total) by mouth every 6 (six) hours as needed for dizziness or nausea. 01/14/20   Meisinger, Todd, MD  zolpidem (AMBIEN) 5 MG tablet Take 5 mg by mouth at bedtime as needed. 09/19/21   [provider]    Family History Family History  Problem Relation Age of Onset   Hypertension Mother    Hyperlipidemia Mother    Hypertension Father     Hyperlipidemia Father     Social History Social History   Tobacco Use   Smoking status: Former    Types: Cigarettes    Quit date: 10/21/2005    Years since quitting: 15.9   Smokeless tobacco: Never  Vaping Use   Vaping Use: Never used  Substance Use Topics   Alcohol use: Not Currently    Comment: socially   Drug use: No     Allergies   Acetaminophen-codeine, Codeine, Septra [sulfamethoxazole-trimethoprim], and Tegaderm ag mesh [silver]   Review of Systems Review of Systems Pertinent negatives listed in HPI   Physical Exam Triage Vital Signs ED Triage Vitals  Enc Vitals Group     BP      Pulse      Resp      Temp      Temp src      SpO2      Weight      Height      Head Circumference      Peak Flow      Pain Score      Pain Loc      Pain Edu?      Excl. in GC?    No data found.  Updated Vital Signs BP 129/88 (BP Location: Left Arm)   Pulse 93   Temp 98.7 F (37.1 C) (Oral)   Resp 16   SpO2 97%   Visual Acuity Right Eye Distance:   Left Eye Distance:   Bilateral Distance:    Right Eye Near:   Left Eye Near:    Bilateral Near:     Physical Exam General appearance: alert, well developed, well nourished, cooperative and in no distress Head: Normocephalic, without obvious abnormality, atraumatic Respiratory: Respirations even and unlabored, normal respiratory rate Heart: Rate and rhythm normal.   CVA: Left flank pain Extremities: No gross deformities Skin: Skin color, texture, turgor normal. No rashes seen  Psych: Appropriate mood and affect.   UC Treatments / Results  Labs (all labs ordered are listed, but only abnormal results are displayed) Labs Reviewed  POCT URINALYSIS DIP (MANUAL ENTRY) - Abnormal; Notable for the following components:      Result Value   Color, UA brown (*)    Clarity, UA cloudy (*)    Spec Grav, UA >=1.030 (*)    Blood, UA large (*)    Protein Ur, POC >=300 (*)    Nitrite, UA Positive (*)    Leukocytes, UA  Small (1+) (*)    All other components within normal limits  URINE CULTURE  POCT URINE PREGNANCY    EKG   Radiology No results found.  Procedures Procedures (including critical care time)  Medications Ordered in UC Medications - No data to display  Initial Impression / Assessment and Plan / UC Course  I have reviewed the triage vital signs and the nursing notes.  Pertinent labs & imaging results that were available during my care of the patient were reviewed by me and considered in my medical decision making (see chart for details).    Acute Cystitis Hematuria, treating with Macrobid 100 mg twice daily  x 5 days.  May extend antibiotics 2 additional days if symptoms have not completely resolved following  Urine culture pending. Urine HCG negative. Hydrate well with water.  Final Clinical Impressions(s) / UC Diagnoses   Final diagnoses:  Acute cystitis with hematuria     Discharge Instructions      Urine culture pending. Start Macrobid 100 mg twice daily x 5 days.     ED Prescriptions     Medication Sig Dispense Auth. Provider   nitrofurantoin, macrocrystal-monohydrate, (MACROBID) 100 MG capsule Take 1 capsule (100 mg total) by mouth 2 (two) times daily. 10 capsule Bing Neighbors, FNP   phenazopyridine (PYRIDIUM) 200 MG tablet Take 1 tablet (200 mg total) by mouth 3 (three) times daily as needed for pain. 10 tablet Bing Neighbors, FNP      PDMP not reviewed this encounter.   Bing Neighbors, FNP 09/19/21 1505

## 2021-09-19 NOTE — Discharge Instructions (Signed)
Urine culture pending. Start Macrobid 100 mg twice daily x 5 days.

## 2021-09-22 LAB — URINE CULTURE
Culture: >=100000 — AB
Special Requests: NORMAL

## 2021-09-23 ENCOUNTER — Telehealth (HOSPITAL_COMMUNITY): Payer: Self-pay | Admitting: Emergency Medicine

## 2021-09-23 MED ORDER — CEPHALEXIN 500 MG PO CAPS: 500.0000 mg | ORAL_CAPSULE | Freq: Two times a day (BID) | ORAL | 0 refills | Status: AC

## 2021-09-23 MED ORDER — FLUCONAZOLE 150 MG PO TABS: 150.0000 mg | ORAL_TABLET | Freq: Once | ORAL | 0 refills | Status: AC

## 2021-09-23 NOTE — Telephone Encounter (Signed)
Patient returned call and requested antibiotic associated yeast medication as well.

## 2023-08-10 ENCOUNTER — Ambulatory Visit
Admission: EM | Admit: 2023-08-10 | Discharge: 2023-08-10 | Disposition: A | Discharge status: Home / Self Care | Attending: Emergency Medicine | Admitting: Emergency Medicine

## 2023-08-10 DIAGNOSIS — J069 Acute upper respiratory infection, unspecified: Secondary | ICD-10-CM

## 2023-08-10 DIAGNOSIS — R590 Localized enlarged lymph nodes: Secondary | ICD-10-CM

## 2023-08-10 MED ORDER — PREDNISONE 10 MG (21) PO TBPK: ORAL_TABLET | Freq: Every day | ORAL | 0 refills | Status: AC

## 2023-08-10 MED ORDER — AMOXICILLIN-POT CLAVULANATE 875-125 MG PO TABS: 1.0000 | ORAL_TABLET | Freq: Two times a day (BID) | ORAL | 0 refills | Status: AC

## 2023-08-10 NOTE — ED Triage Notes (Signed)
 Triaged by provider

## 2023-08-10 NOTE — Discharge Instructions (Addendum)
 You believe your symptoms today are lingering symptoms from your recent upper respiratory infection  On exam the lump that you are feeling on the side of your neck is a swollen lymph node which is a part of a draining symptom and at times they do swell during times of infection and when the sinuses are irritated as well as congestion  Begin Augmentin twice daily for 7 days to clear any bacteria causing symptoms to linger as they have been present for 3 weeks and are now worsening  Starting tomorrow take prednisone every morning with food as directed to reduce inflammation and help with pain  If tolerable may massage the lymph node to help it drain and reduce the size, may also hold warm compresses over the affected area  Total taking steroid May use ibuprofen  600 to 800 mg every 6-8 hours, may take Tylenol  500 to 1000 mg every 6 hours additionally    For cough: honey 1/2 to 1 teaspoon (you can dilute the honey in water or another fluid).  You can also use guaifenesin and dextromethorphan for cough. You can use a humidifier for chest congestion and cough.  If you don't have a humidifier, you can sit in the bathroom with the hot shower running.      For sore throat: try warm salt water gargles, cepacol lozenges, throat spray, warm tea or water with lemon/honey, popsicles or ice, or OTC cold relief medicine for throat discomfort.   For congestion: take a daily anti-histamine like Zyrtec, Claritin, and a oral decongestant, such as pseudoephedrine.  You can also use Flonase 1-2 sprays in each nostril daily.   It is important to stay hydrated: drink plenty of fluids (water, gatorade/powerade/pedialyte, juices, or teas) to keep your throat moisturized and help further relieve irritation/discomfort.

## 2023-08-11 NOTE — ED Provider Notes (Signed)
 Arlander Bellman    CSN: [id number] Arrival date & time: 08/10/23  1935      History   Chief Complaint No chief complaint on file.   HPI Joann Jones is a 38 y.o. female.   Patient presents for evaluation of a worsening sore throat and lump to the right side of the neck beginning 1 day ago.  Able to feel the lump when the head is turned, causing stiffness and pain.  Endorses that on 07/21/2022 she had upper respiratory infection which she was evaluated for 3 days later in a different urgent care for all viral testing was negative, deemed to have an ear infection and prescribed antibiotics which she did not complete as she started to vomit the medication.  Endorses that symptoms did improve but sore throat never fully resolved.  Has been tolerating food and liquids.  Known sick contact in household.  Has not attempted treatment of new symptoms.  Denies difficulty swallowing, shortness of breath or wheezing.  Past Medical History:  Diagnosis Date   Anemia    Anxiety    Asthma    with exertion   Collagen vascular disease (HCC)    Complication of anesthesia    hard to wake up   Dyspnea    Dysrhythmia    tachycardia   GERD (gastroesophageal reflux disease)    Hypertension    IBS (irritable bowel syndrome)    ITP (idiopathic thrombocytopenic purpura)    Lupus (HCC)    Migraines    Palpitations    Pelvic kidney    Sleep apnea    SVD (spontaneous vaginal delivery) 01/12/2020    Patient Active Problem List   Diagnosis Date Noted   IUGR (intrauterine growth restriction) affecting care of mother 01/12/2020   SVD (spontaneous vaginal delivery) 01/12/2020   Lupus    IBS (irritable bowel syndrome)    GERD (gastroesophageal reflux disease)    Shortness of breath during pregnancy 10/26/2019   Anemia during pregnancy 10/26/2019    Past Surgical History:  Procedure Laterality Date   COLONOSCOPY WITH PROPOFOL  N/A 07/18/2016   Procedure: COLONOSCOPY WITH PROPOFOL ;  Surgeon:  Cassie Click, MD;  Location[facility] ENDOSCOPY;  Service: Endoscopy;  Laterality: N/A;   ESOPHAGOGASTRODUODENOSCOPY (EGD) WITH PROPOFOL  N/A 07/18/2016   Procedure: ESOPHAGOGASTRODUODENOSCOPY (EGD) WITH PROPOFOL ;  Surgeon: Cassie Click, MD;  Location: [facility] ENDOSCOPY;  Service: Endoscopy;  Laterality: N/A;   HERNIA REPAIR     WRIST SURGERY Right     OB History     Gravida  2   Para  2   Term  1   Preterm  1   AB      Living  2      SAB      IAB      Ectopic      Multiple  0   Live Births  2            Home Medications    Prior to Admission medications   Medication Sig Start Date End Date Taking? Authorizing Provider  amLODipine  (NORVASC ) 2.5 MG tablet Take 2.5 mg by mouth daily.    [provider]  amoxicillin-clavulanate (AUGMENTIN) 875-125 MG tablet Take 1 tablet by mouth every 12 (twelve) hours. 08/10/23  Yes Collin Rengel R, NP  ferrous sulfate  325 (65 FE) MG EC tablet Take 1 tablet (325 mg total) by mouth every other day. 10/26/19   Dawson, Rolitta, CNM  hydroxychloroquine  (PLAQUENIL ) 200 MG tablet Take by  mouth daily.    [provider]  ibuprofen  (ADVIL ) 600 MG tablet Take 1 tablet (600 mg total) by mouth every 6 (six) hours. 01/14/20   Meisinger, Ena Harries, MD  levonorgestrel-ethinyl estradiol (SEASONALE) 0.15-0.03 MG tablet Take 1 tablet by mouth daily. 04/19/21   [provider]  meclizine  (ANTIVERT ) 25 MG tablet Take 1 tablet (25 mg total) by mouth every 6 (six) hours as needed for dizziness or nausea. 01/14/20   Meisinger, Todd, MD  nitrofurantoin , macrocrystal-monohydrate, (MACROBID ) 100 MG capsule Take 1 capsule (100 mg total) by mouth 2 (two) times daily. 09/19/21   Buena Carmine, NP  omeprazole (PRILOSEC) 40 MG capsule Take by mouth. 10/15/18   [provider]  phenazopyridine  (PYRIDIUM ) 200 MG tablet Take 1 tablet (200 mg total) by mouth 3 (three) times daily as needed for pain. 09/19/21   Buena Carmine, NP   predniSONE (STERAPRED UNI-PAK 21 TAB) 10 MG (21) TBPK tablet Take by mouth daily. Take 6 tabs by mouth daily  for 1 days, then 5 tabs for 1 days, then 4 tabs for 1 days, then 3 tabs for 1 days, 2 tabs for 1 days, then 1 tab by mouth daily for 1 days 08/10/23  Yes Oneill Bais, Maybelle Spatz, NP  Prenatal Vit-Fe Fumarate-FA (PRENATAL VITAMINS PO) Take by mouth.    [provider]  sertraline  (ZOLOFT ) 100 MG tablet Take by mouth at bedtime.  09/21/18 09/19/21  [provider]  zolpidem  (AMBIEN ) 5 MG tablet Take 5 mg by mouth at bedtime as needed. 09/19/21   [provider]    Family History Family History  Problem Relation Age of Onset   Hypertension Mother    Hyperlipidemia Mother    Hypertension Father    Hyperlipidemia Father     Social History Social History   Tobacco Use   Smoking status: Former    Current packs/day: 0.00    Types: Cigarettes    Quit date: 10/21/2005    Years since quitting: 17.8   Smokeless tobacco: Never  Vaping Use   Vaping status: Never Used  Substance Use Topics   Alcohol use: Not Currently    Comment: socially   Drug use: No     Allergies   Acetaminophen -codeine, Codeine, Septra [sulfamethoxazole-trimethoprim ], and Tegaderm ag mesh [silver]   Review of Systems Review of Systems   Physical Exam Triage Vital Signs ED Triage Vitals [08/10/23 1955]  Encounter Vitals Group     BP 107/71     Systolic BP Percentile      Diastolic BP Percentile      Pulse Rate 83     Resp 18     Temp 98.8 F (37.1 C)     Temp Source Oral     SpO2 95 %     Weight      Height      Head Circumference      Peak Flow      Pain Score      Pain Loc      Pain Education      Exclude from Growth Chart    No data found.  Updated Vital Signs BP 107/71 (BP Location: Left Arm)   Pulse 83   Temp 98.8 F (37.1 C) (Oral)   Resp 18   SpO2 95%   Visual Acuity Right Eye Distance:   Left Eye Distance:   Bilateral Distance:    Right Eye Near:    Left Eye Near:    Bilateral Near:  Physical Exam Constitutional:      Appearance: Normal appearance.  HENT:     Head: Normocephalic.     Right Ear: Tympanic membrane, ear canal and external ear normal.     Left Ear: Tympanic membrane, ear canal and external ear normal.     Nose: Nose normal.     Mouth/Throat:     Pharynx: No oropharyngeal exudate or posterior oropharyngeal erythema.  Pulmonary:     Effort: Pulmonary effort is normal.  Musculoskeletal:     Cervical back: Normal range of motion.  Lymphadenopathy:     Cervical: Cervical adenopathy present.  Neurological:     Mental Status: She is alert and oriented to person, place, and time. Mental status is at baseline.      UC Treatments / Results  Labs (all labs ordered are listed, but only abnormal results are displayed) Labs Reviewed - No data to display  EKG   Radiology No results found.  Procedures Procedures (including critical care time)  Medications Ordered in UC Medications - No data to display  Initial Impression / Assessment and Plan / UC Course  I have reviewed the triage vital signs and the nursing notes.  Pertinent labs & imaging results that were available during my care of the patient were reviewed by me and considered in my medical decision making (see chart for details).  Acute URI, cervical adenopathy  Lump to the throat is a lymph node, discussed this with patient, at this time most likely related to bacterial upper respiratory infection, no abnormality to the ears noted on exam, did discuss this with patient, symptoms initially beginning 3 days ago, lingering and now worsening therefore will provide bacterial coverage, prescribed Augmentin and prednisone, recommended over-the-counter medications and nonpharmacological supportive care and advised follow-up if symptoms do not improve Final Clinical Impressions(s) / UC Diagnoses   Final diagnoses:  Cervical adenopathy  Acute URI      Discharge Instructions      You believe your symptoms today are lingering symptoms from your recent upper respiratory infection  On exam the lump that you are feeling on the side of your neck is a swollen lymph node which is a part of a draining symptom and at times they do swell during times of infection and when the sinuses are irritated as well as congestion  Begin Augmentin twice daily for 7 days to clear any bacteria causing symptoms to linger as they have been present for 3 weeks and are now worsening  Starting tomorrow take prednisone every morning with food as directed to reduce inflammation and help with pain  If tolerable may massage the lymph node to help it drain and reduce the size, may also hold warm compresses over the affected area  Total taking steroid May use ibuprofen  600 to 800 mg every 6-8 hours, may take Tylenol  500 to 1000 mg every 6 hours additionally    For cough: honey 1/2 to 1 teaspoon (you can dilute the honey in water or another fluid).  You can also use guaifenesin and dextromethorphan for cough. You can use a humidifier for chest congestion and cough.  If you don't have a humidifier, you can sit in the bathroom with the hot shower running.      For sore throat: try warm salt water gargles, cepacol lozenges, throat spray, warm tea or water with lemon/honey, popsicles or ice, or OTC cold relief medicine for throat discomfort.   For congestion: take a daily anti-histamine like Zyrtec, Claritin, and  a oral decongestant, such as pseudoephedrine.  You can also use Flonase 1-2 sprays in each nostril daily.   It is important to stay hydrated: drink plenty of fluids (water, gatorade/powerade/pedialyte, juices, or teas) to keep your throat moisturized and help further relieve irritation/discomfort.    ED Prescriptions     Medication Sig Dispense Auth. Provider   amoxicillin-clavulanate (AUGMENTIN) 875-125 MG tablet Take 1 tablet by mouth every 12 (twelve)  hours. 14 tablet Jonnie Kubly R, NP   predniSONE (STERAPRED UNI-PAK 21 TAB) 10 MG (21) TBPK tablet Take by mouth daily. Take 6 tabs by mouth daily  for 1 days, then 5 tabs for 1 days, then 4 tabs for 1 days, then 3 tabs for 1 days, 2 tabs for 1 days, then 1 tab by mouth daily for 1 days 21 tablet Ashya Nicolaisen, Maybelle Spatz, NP      PDMP not reviewed this encounter.   Reena Canning, [place] 08/11/23 [phone]
